# Patient Record
Sex: Female | Born: 1935 | Race: White | Hispanic: No | Smoking: Never smoker
Health system: Southern US, Community
[De-identification: ages and names within clinical notes are randomized; demographics above are authoritative.]

## PROBLEM LIST (undated history)

## (undated) DIAGNOSIS — G2581 Restless legs syndrome: Secondary | ICD-10-CM

## (undated) DIAGNOSIS — G4730 Sleep apnea, unspecified: Secondary | ICD-10-CM

## (undated) DIAGNOSIS — I10 Essential (primary) hypertension: Secondary | ICD-10-CM

---

## 2012-12-19 ENCOUNTER — Ambulatory Visit: Payer: Medicare Other | Attending: Neurology | Admitting: Physical Therapy

## 2012-12-19 DIAGNOSIS — IMO0001 Reserved for inherently not codable concepts without codable children: Secondary | ICD-10-CM | POA: Insufficient documentation

## 2012-12-19 DIAGNOSIS — M2569 Stiffness of other specified joint, not elsewhere classified: Secondary | ICD-10-CM | POA: Insufficient documentation

## 2012-12-19 DIAGNOSIS — M542 Cervicalgia: Secondary | ICD-10-CM | POA: Insufficient documentation

## 2012-12-23 ENCOUNTER — Ambulatory Visit: Payer: Medicare Other | Admitting: Physical Therapy

## 2012-12-25 ENCOUNTER — Ambulatory Visit: Payer: Medicare Other | Admitting: Physical Therapy

## 2012-12-31 ENCOUNTER — Ambulatory Visit: Payer: Medicare Other | Attending: Neurology | Admitting: Physical Therapy

## 2012-12-31 DIAGNOSIS — M542 Cervicalgia: Secondary | ICD-10-CM | POA: Insufficient documentation

## 2012-12-31 DIAGNOSIS — IMO0001 Reserved for inherently not codable concepts without codable children: Secondary | ICD-10-CM | POA: Insufficient documentation

## 2012-12-31 DIAGNOSIS — M2569 Stiffness of other specified joint, not elsewhere classified: Secondary | ICD-10-CM | POA: Insufficient documentation

## 2013-01-02 ENCOUNTER — Ambulatory Visit: Payer: Medicare Other | Admitting: Physical Therapy

## 2013-01-07 ENCOUNTER — Ambulatory Visit: Payer: Medicare Other | Admitting: Physical Therapy

## 2013-01-09 ENCOUNTER — Ambulatory Visit: Payer: Medicare Other | Admitting: Physical Therapy

## 2013-01-15 ENCOUNTER — Ambulatory Visit: Payer: Medicare Other | Admitting: Physical Therapy

## 2013-01-17 ENCOUNTER — Ambulatory Visit: Payer: Medicare Other | Admitting: Physical Therapy

## 2013-01-23 ENCOUNTER — Ambulatory Visit: Payer: Medicare Other | Admitting: Rehabilitation

## 2013-01-24 ENCOUNTER — Ambulatory Visit: Payer: Medicare Other | Admitting: Physical Therapy

## 2013-01-28 ENCOUNTER — Ambulatory Visit: Payer: Medicare Other | Admitting: Physical Therapy

## 2013-01-30 ENCOUNTER — Ambulatory Visit: Payer: Medicare Other | Attending: Neurology | Admitting: Physical Therapy

## 2013-01-30 DIAGNOSIS — M542 Cervicalgia: Secondary | ICD-10-CM | POA: Insufficient documentation

## 2013-01-30 DIAGNOSIS — M2569 Stiffness of other specified joint, not elsewhere classified: Secondary | ICD-10-CM | POA: Insufficient documentation

## 2013-01-30 DIAGNOSIS — IMO0001 Reserved for inherently not codable concepts without codable children: Secondary | ICD-10-CM | POA: Insufficient documentation

## 2013-02-03 ENCOUNTER — Ambulatory Visit: Payer: Medicare Other | Admitting: Physical Therapy

## 2013-02-07 ENCOUNTER — Ambulatory Visit: Payer: Medicare Other | Admitting: Physical Therapy

## 2013-02-11 ENCOUNTER — Ambulatory Visit: Payer: Medicare Other | Admitting: Physical Therapy

## 2013-02-12 ENCOUNTER — Ambulatory Visit: Payer: Medicare Other | Admitting: Physical Therapy

## 2013-02-17 ENCOUNTER — Ambulatory Visit: Payer: Medicare Other | Admitting: Physical Therapy

## 2013-02-19 ENCOUNTER — Ambulatory Visit: Payer: Medicare Other | Admitting: Physical Therapy

## 2013-02-25 ENCOUNTER — Ambulatory Visit: Payer: Medicare Other | Attending: Neurology | Admitting: Physical Therapy

## 2013-02-25 DIAGNOSIS — M2569 Stiffness of other specified joint, not elsewhere classified: Secondary | ICD-10-CM | POA: Insufficient documentation

## 2013-02-25 DIAGNOSIS — M542 Cervicalgia: Secondary | ICD-10-CM | POA: Insufficient documentation

## 2013-02-25 DIAGNOSIS — IMO0001 Reserved for inherently not codable concepts without codable children: Secondary | ICD-10-CM | POA: Insufficient documentation

## 2013-02-27 ENCOUNTER — Ambulatory Visit: Payer: Medicare Other | Admitting: Physical Therapy

## 2013-03-03 ENCOUNTER — Ambulatory Visit: Payer: Medicare Other | Admitting: Physical Therapy

## 2013-03-07 ENCOUNTER — Ambulatory Visit: Payer: Medicare Other | Admitting: Rehabilitation

## 2013-03-11 ENCOUNTER — Ambulatory Visit: Payer: Medicare Other | Admitting: Physical Therapy

## 2013-03-12 ENCOUNTER — Ambulatory Visit: Payer: Medicare Other | Admitting: Physical Therapy

## 2014-02-05 ENCOUNTER — Ambulatory Visit: Payer: Medicare Other | Attending: Foot & Ankle Surgery | Admitting: Physical Therapy

## 2014-02-05 DIAGNOSIS — M25579 Pain in unspecified ankle and joints of unspecified foot: Secondary | ICD-10-CM | POA: Insufficient documentation

## 2014-02-05 DIAGNOSIS — R262 Difficulty in walking, not elsewhere classified: Secondary | ICD-10-CM | POA: Insufficient documentation

## 2014-02-05 DIAGNOSIS — IMO0001 Reserved for inherently not codable concepts without codable children: Secondary | ICD-10-CM | POA: Insufficient documentation

## 2014-02-10 ENCOUNTER — Ambulatory Visit: Payer: Medicare Other | Admitting: Physical Therapy

## 2014-02-12 ENCOUNTER — Ambulatory Visit: Payer: Medicare Other | Admitting: Physical Therapy

## 2014-02-17 ENCOUNTER — Ambulatory Visit: Payer: Medicare Other | Admitting: Physical Therapy

## 2014-02-19 ENCOUNTER — Ambulatory Visit: Payer: Medicare Other | Admitting: Physical Therapy

## 2014-02-26 ENCOUNTER — Ambulatory Visit: Payer: Medicare Other | Attending: Foot & Ankle Surgery | Admitting: Physical Therapy

## 2014-02-26 DIAGNOSIS — M25579 Pain in unspecified ankle and joints of unspecified foot: Secondary | ICD-10-CM | POA: Insufficient documentation

## 2014-02-26 DIAGNOSIS — IMO0001 Reserved for inherently not codable concepts without codable children: Secondary | ICD-10-CM | POA: Insufficient documentation

## 2014-02-26 DIAGNOSIS — R262 Difficulty in walking, not elsewhere classified: Secondary | ICD-10-CM | POA: Insufficient documentation

## 2014-03-03 ENCOUNTER — Ambulatory Visit: Payer: Medicare Other | Admitting: Physical Therapy

## 2014-03-12 ENCOUNTER — Ambulatory Visit: Payer: Medicare Other | Admitting: Physical Therapy

## 2014-03-13 ENCOUNTER — Ambulatory Visit: Payer: Medicare Other | Admitting: Physical Therapy

## 2015-01-25 ENCOUNTER — Emergency Department (HOSPITAL_BASED_OUTPATIENT_CLINIC_OR_DEPARTMENT_OTHER)
Admission: EM | Admit: 2015-01-25 | Discharge: 2015-01-26 | Disposition: A | Discharge status: Home / Self Care | Payer: Medicare Other | Attending: Emergency Medicine | Admitting: Emergency Medicine

## 2015-01-25 ENCOUNTER — Encounter (HOSPITAL_BASED_OUTPATIENT_CLINIC_OR_DEPARTMENT_OTHER): Payer: Self-pay | Admitting: *Deleted

## 2015-01-25 DIAGNOSIS — I10 Essential (primary) hypertension: Secondary | ICD-10-CM | POA: Diagnosis not present

## 2015-01-25 DIAGNOSIS — N3000 Acute cystitis without hematuria: Secondary | ICD-10-CM

## 2015-01-25 DIAGNOSIS — G2581 Restless legs syndrome: Secondary | ICD-10-CM | POA: Diagnosis not present

## 2015-01-25 DIAGNOSIS — Z79899 Other long term (current) drug therapy: Secondary | ICD-10-CM | POA: Insufficient documentation

## 2015-01-25 DIAGNOSIS — R52 Pain, unspecified: Secondary | ICD-10-CM | POA: Diagnosis present

## 2015-01-25 HISTORY — DX: Restless legs syndrome: G25.81

## 2015-01-25 HISTORY — DX: Essential (primary) hypertension: I10

## 2015-01-25 HISTORY — DX: Sleep apnea, unspecified: G47.30

## 2015-01-25 LAB — CBC
HCT: 37.4 % (ref 36.0–46.0)
Hemoglobin: 12.6 g/dL (ref 12.0–15.0)
MCH: 28 pg (ref 26.0–34.0)
MCHC: 33.7 g/dL (ref 30.0–36.0)
MCV: 83.1 fL (ref 78.0–100.0)
PLATELETS: 245 10*3/uL (ref 150–400)
RBC: 4.5 MIL/uL (ref 3.87–5.11)
RDW: 13.9 % (ref 11.5–15.5)
WBC: 10.8 10*3/uL — AB (ref 4.0–10.5)

## 2015-01-25 NOTE — ED Notes (Signed)
Pt c/o generalized body aches x 4 days, seen by PMD x 3 days Dx restless leg referred to Neuro appt sched for 3/3

## 2015-01-26 DIAGNOSIS — N3000 Acute cystitis without hematuria: Secondary | ICD-10-CM | POA: Diagnosis not present

## 2015-01-26 LAB — URINALYSIS, ROUTINE W REFLEX MICROSCOPIC
BILIRUBIN URINE: NEGATIVE
Glucose, UA: NEGATIVE mg/dL
KETONES UR: NEGATIVE mg/dL
Nitrite: NEGATIVE
Protein, ur: NEGATIVE mg/dL
Specific Gravity, Urine: 1.012 (ref 1.005–1.030)
Urobilinogen, UA: 0.2 mg/dL (ref 0.0–1.0)
pH: 5.5 (ref 5.0–8.0)

## 2015-01-26 LAB — COMPREHENSIVE METABOLIC PANEL
ALT: 59 U/L — ABNORMAL HIGH (ref 0–35)
ANION GAP: 7 (ref 5–15)
AST: 68 U/L — AB (ref 0–37)
Albumin: 3.3 g/dL — ABNORMAL LOW (ref 3.5–5.2)
Alkaline Phosphatase: 81 U/L (ref 39–117)
BUN: 53 mg/dL — ABNORMAL HIGH (ref 6–23)
CHLORIDE: 104 mmol/L (ref 96–112)
CO2: 25 mmol/L (ref 19–32)
Calcium: 8.8 mg/dL (ref 8.4–10.5)
Creatinine, Ser: 1.35 mg/dL — ABNORMAL HIGH (ref 0.50–1.10)
GFR calc Af Amer: 42 mL/min — ABNORMAL LOW (ref >90)
GFR calc non Af Amer: 36 mL/min — ABNORMAL LOW (ref >90)
Glucose, Bld: 113 mg/dL — ABNORMAL HIGH (ref 70–99)
POTASSIUM: 3.8 mmol/L (ref 3.5–5.1)
Sodium: 136 mmol/L (ref 135–145)
Total Bilirubin: 0.5 mg/dL (ref 0.3–1.2)
Total Protein: 7.8 g/dL (ref 6.0–8.3)

## 2015-01-26 LAB — URINE MICROSCOPIC-ADD ON

## 2015-01-26 MED ORDER — CEPHALEXIN 500 MG PO CAPS: 500.0000 mg | ORAL_CAPSULE | Freq: Two times a day (BID) | ORAL | Status: DC

## 2015-01-26 MED ORDER — CEPHALEXIN 250 MG PO CAPS
1000.0000 mg | ORAL_CAPSULE | Freq: Once | ORAL | Status: AC
  Administered 2015-01-26: 1000 mg via ORAL
  Filled 2015-01-26: qty 4

## 2015-01-26 NOTE — ED Provider Notes (Signed)
CSN: [id number]     Arrival date & time 01/25/15  2331 History  This chart was scribed for Ashley SeamenJohn L Brannon Decaire, MD by Tonye RoyaltyJoshua Chen, ED Scribe. This patient was seen in room MH04/MH04 and the patient's care was started at 12:28 AM.    Chief Complaint  Patient presents with  . Generalized Body Aches   The history is provided by the patient. No language interpreter was used.    HPI Comments: Ashley Espinoza is a 81 y.o. female with history of restless leg syndrome who presents to the Emergency Department complaining of body aches affecting muscles and joints with onset 4 days ago. Symptoms are consistent with restless legs but worse than usual. She went to her PCP and was referred to a specialist for restless legs. She has been started on gabapentin, and her dose is being titrated upward, and has not had significant relief yet. She was instructed to come to ED if pain got bad enough. She denies fever, chills, chest pain, SOB, cough, nausea, vomiting, diarrhea or dysuria.   Past Medical History  Diagnosis Date  . Sleep apnea   . RLS (restless legs syndrome)   . Hypertension    History reviewed. No pertinent past surgical history. History reviewed. No pertinent family history. History  Substance Use Topics  . Smoking status: Never Smoker   . Smokeless tobacco: Not on file  . Alcohol Use: No   OB History    No data available     Review of Systems A complete 10 system review of systems was obtained and all systems are negative except as noted in the HPI and PMH.    Allergies  Ivp dye and Lidocaine  Home Medications   Prior to Admission medications   Medication Sig Start Date End Date Taking? Authorizing Provider  amitriptyline (ELAVIL) 50 MG tablet Take 50 mg by mouth at bedtime.   Yes Historical Provider, MD  furosemide (LASIX) 40 MG tablet Take 40 mg by mouth.   Yes Historical Provider, MD  gabapentin (NEURONTIN) 300 MG capsule Take 300 mg by mouth 3 (three) times daily.   Yes  Historical Provider, MD  olmesartan (BENICAR) 20 MG tablet Take 20 mg by mouth daily.   Yes Historical Provider, MD  probenecid (BENEMID) 500 MG tablet Take 500 mg by mouth 2 (two) times daily.   Yes Historical Provider, MD  cephALEXin (KEFLEX) 500 MG capsule Take 1 capsule (500 mg total) by mouth 2 (two) times daily. 01/26/15   Amiree No L Meridith Romick, MD   BP 138/88 mmHg  Pulse 98  Temp(Src) 98.4 F (36.9 C)  Resp 16  Ht 5\' 6"  (1.676 m)  Wt 189 lb (85.73 kg)  BMI 30.52 kg/m2  SpO2 100%   Physical Exam  Nursing note and vitals reviewed. General: Well-developed, well-nourished female in no acute distress; appearance consistent with age of record HENT: normocephalic; atraumatic Eyes: pupils equal, round and reactive to light; extraocular muscles intact; lens implants Neck: supple Heart: regular rate and rhythm; no murmur Lungs: clear to auscultation bilaterally Abdomen: soft; nondistended; nontender; no masses or hepatosplenomegaly; bowel sounds present Extremities: No deformity; full range of motion; pulses normal Neurologic: Awake, alert and oriented; motor function intact in all extremities and symmetric; no facial droop; twitching of legs Skin: Warm and dry Psychiatric: Normal mood and affect   ED Course  Procedures (including critical care time)  DIAGNOSTIC STUDIES: Oxygen Saturation is 100% on room air, normal by my interpretation.    COORDINATION OF  CARE: 12:37 AM Discussed treatment plan with patient at beside, the patient agrees with the plan and has no further questions at this time.   MDM   Results for orders placed or performed during the hospital encounter of 01/25/15 (from the past 24 hour(s))  CBC     Status: Abnormal   Collection Time: 01/25/15 11:43 PM  Result Value Ref Range   WBC 10.8 (H) 4.0 - 10.5 K/uL   RBC 4.50 3.87 - 5.11 MIL/uL   Hemoglobin 12.6 12.0 - 15.0 g/dL   HCT 16.1 09.6 - 04.5 %   MCV 83.1 78.0 - 100.0 fL   MCH 28.0 26.0 - 34.0 pg   MCHC 33.7  30.0 - 36.0 g/dL   RDW 40.9 81.1 - 91.4 %   Platelets 245 150 - 400 K/uL  Comprehensive metabolic panel     Status: Abnormal   Collection Time: 01/25/15 11:43 PM  Result Value Ref Range   Sodium 136 135 - 145 mmol/L   Potassium 3.8 3.5 - 5.1 mmol/L   Chloride 104 96 - 112 mmol/L   CO2 25 19 - 32 mmol/L   Glucose, Bld 113 (H) 70 - 99 mg/dL   BUN 53 (H) 6 - 23 mg/dL   Creatinine, Ser 7.82 (H) 0.50 - 1.10 mg/dL   Calcium 8.8 8.4 - 95.6 mg/dL   Total Protein 7.8 6.0 - 8.3 g/dL   Albumin 3.3 (L) 3.5 - 5.2 g/dL   AST 68 (H) 0 - 37 U/L   ALT 59 (H) 0 - 35 U/L   Alkaline Phosphatase 81 39 - 117 U/L   Total Bilirubin 0.5 0.3 - 1.2 mg/dL   GFR calc non Af Amer 36 (L) >90 mL/min   GFR calc Af Amer 42 (L) >90 mL/min   Anion gap 7 5 - 15  Urinalysis, Routine w reflex microscopic     Status: Abnormal   Collection Time: 01/25/15 11:50 PM  Result Value Ref Range   Color, Urine YELLOW YELLOW   APPearance CLOUDY (A) CLEAR   Specific Gravity, Urine 1.012 1.005 - 1.030   pH 5.5 5.0 - 8.0   Glucose, UA NEGATIVE NEGATIVE mg/dL   Hgb urine dipstick SMALL (A) NEGATIVE   Bilirubin Urine NEGATIVE NEGATIVE   Ketones, ur NEGATIVE NEGATIVE mg/dL   Protein, ur NEGATIVE NEGATIVE mg/dL   Urobilinogen, UA 0.2 0.0 - 1.0 mg/dL   Nitrite NEGATIVE NEGATIVE   Leukocytes, UA LARGE (A) NEGATIVE  Urine microscopic-add on     Status: Abnormal   Collection Time: 01/25/15 11:50 PM  Result Value Ref Range   Squamous Epithelial / LPF RARE RARE   WBC, UA 21-50 <3 WBC/hpf   RBC / HPF 0-2 <3 RBC/hpf   Bacteria, UA MANY (A) RARE    Final diagnoses:  Acute cystitis without hematuria  Restless leg syndrome   I personally performed the services described in this documentation, which was scribed in my presence. The recorded information has been reviewed and is accurate.   Ashley Seamen, MD 01/26/15 [phone]

## 2017-05-23 ENCOUNTER — Encounter (HOSPITAL_BASED_OUTPATIENT_CLINIC_OR_DEPARTMENT_OTHER): Payer: Self-pay | Admitting: *Deleted

## 2017-05-23 ENCOUNTER — Emergency Department (HOSPITAL_BASED_OUTPATIENT_CLINIC_OR_DEPARTMENT_OTHER)
Admission: EM | Admit: 2017-05-23 | Discharge: 2017-05-23 | Disposition: A | Discharge status: Home / Self Care | Payer: Medicare Other | Attending: Emergency Medicine | Admitting: Emergency Medicine

## 2017-05-23 DIAGNOSIS — M353 Polymyalgia rheumatica: Secondary | ICD-10-CM | POA: Diagnosis not present

## 2017-05-23 DIAGNOSIS — M130 Polyarthritis, unspecified: Secondary | ICD-10-CM | POA: Insufficient documentation

## 2017-05-23 DIAGNOSIS — N3000 Acute cystitis without hematuria: Secondary | ICD-10-CM | POA: Insufficient documentation

## 2017-05-23 DIAGNOSIS — R300 Dysuria: Secondary | ICD-10-CM | POA: Diagnosis present

## 2017-05-23 DIAGNOSIS — Z79899 Other long term (current) drug therapy: Secondary | ICD-10-CM | POA: Diagnosis not present

## 2017-05-23 DIAGNOSIS — I10 Essential (primary) hypertension: Secondary | ICD-10-CM | POA: Insufficient documentation

## 2017-05-23 LAB — URINALYSIS, MICROSCOPIC (REFLEX): RBC / HPF: NONE SEEN RBC/hpf (ref 0–5)

## 2017-05-23 LAB — URINALYSIS, ROUTINE W REFLEX MICROSCOPIC
Bilirubin Urine: NEGATIVE
Glucose, UA: NEGATIVE mg/dL
KETONES UR: NEGATIVE mg/dL
Nitrite: NEGATIVE
PH: 7 (ref 5.0–8.0)
Protein, ur: NEGATIVE mg/dL
Specific Gravity, Urine: 1.012 (ref 1.005–1.030)

## 2017-05-23 LAB — URIC ACID: Uric Acid, Serum: 10.3 mg/dL — ABNORMAL HIGH (ref 2.3–6.6)

## 2017-05-23 LAB — CBC WITH DIFFERENTIAL/PLATELET
BASOS ABS: 0 10*3/uL (ref 0.0–0.1)
Basophils Relative: 0 %
EOS PCT: 4 %
Eosinophils Absolute: 0.4 10*3/uL (ref 0.0–0.7)
HCT: 38.7 % (ref 36.0–46.0)
HEMOGLOBIN: 13.8 g/dL (ref 12.0–15.0)
LYMPHS ABS: 1.1 10*3/uL (ref 0.7–4.0)
Lymphocytes Relative: 10 %
MCH: 30 pg (ref 26.0–34.0)
MCHC: 35.7 g/dL (ref 30.0–36.0)
MCV: 84.1 fL (ref 78.0–100.0)
Monocytes Absolute: 0.8 10*3/uL (ref 0.1–1.0)
Monocytes Relative: 8 %
NEUTROS PCT: 78 %
Neutro Abs: 8.2 10*3/uL — ABNORMAL HIGH (ref 1.7–7.7)
PLATELETS: 233 10*3/uL (ref 150–400)
RBC: 4.6 MIL/uL (ref 3.87–5.11)
RDW: 14.1 % (ref 11.5–15.5)
WBC: 10.4 10*3/uL (ref 4.0–10.5)

## 2017-05-23 LAB — COMPREHENSIVE METABOLIC PANEL
ALK PHOS: 94 U/L (ref 38–126)
ALT: 35 U/L (ref 14–54)
AST: 21 U/L (ref 15–41)
Albumin: 3.8 g/dL (ref 3.5–5.0)
Anion gap: 13 (ref 5–15)
BUN: 50 mg/dL — AB (ref 6–20)
CHLORIDE: 99 mmol/L — AB (ref 101–111)
CO2: 26 mmol/L (ref 22–32)
CREATININE: 1.75 mg/dL — AB (ref 0.44–1.00)
Calcium: 10 mg/dL (ref 8.9–10.3)
GFR calc Af Amer: 30 mL/min — ABNORMAL LOW (ref >60)
GFR calc non Af Amer: 26 mL/min — ABNORMAL LOW (ref >60)
Glucose, Bld: 136 mg/dL — ABNORMAL HIGH (ref 65–99)
Potassium: 3.7 mmol/L (ref 3.5–5.1)
SODIUM: 138 mmol/L (ref 135–145)
Total Bilirubin: 0.6 mg/dL (ref 0.3–1.2)
Total Protein: 7.7 g/dL (ref 6.5–8.1)

## 2017-05-23 LAB — CK: Total CK: 59 U/L (ref 38–234)

## 2017-05-23 LAB — SEDIMENTATION RATE: Sed Rate: 75 mm/hr — ABNORMAL HIGH (ref 0–22)

## 2017-05-23 MED ORDER — MORPHINE SULFATE (PF) 2 MG/ML IV SOLN
2.0000 mg | INTRAVENOUS | Status: DC | PRN
  Administered 2017-05-23: 2 mg via INTRAVENOUS
  Filled 2017-05-23: qty 1

## 2017-05-23 MED ORDER — METHYLPREDNISOLONE 4 MG PO TBPK: ORAL_TABLET | ORAL | 0 refills | Status: DC

## 2017-05-23 MED ORDER — METHYLPREDNISOLONE SODIUM SUCC 125 MG IJ SOLR
125.0000 mg | Freq: Once | INTRAMUSCULAR | Status: AC
  Administered 2017-05-23: 125 mg via INTRAVENOUS
  Filled 2017-05-23: qty 2

## 2017-05-23 MED ORDER — SULFAMETHOXAZOLE-TRIMETHOPRIM 800-160 MG PO TABS: 1.0000 | ORAL_TABLET | Freq: Two times a day (BID) | ORAL | 0 refills | Status: DC

## 2017-05-23 MED ORDER — DEXTROSE 5 % IV SOLN
1.0000 g | Freq: Once | INTRAVENOUS | Status: AC
  Administered 2017-05-23: 1 g via INTRAVENOUS
  Filled 2017-05-23: qty 10

## 2017-05-23 MED ORDER — OXYCODONE-ACETAMINOPHEN 5-325 MG PO TABS
1.0000 | ORAL_TABLET | Freq: Once | ORAL | Status: AC
  Administered 2017-05-23: 1 via ORAL
  Filled 2017-05-23: qty 1

## 2017-05-23 MED ORDER — OXYCODONE HCL 5 MG PO TABS
5.0000 mg | ORAL_TABLET | Freq: Once | ORAL | Status: AC
  Administered 2017-05-23: 5 mg via ORAL
  Filled 2017-05-23: qty 1

## 2017-05-23 MED ORDER — OXYCODONE HCL 5 MG PO TABS: 5.0000 mg | ORAL_TABLET | Freq: Four times a day (QID) | ORAL | 0 refills | Status: DC | PRN

## 2017-05-23 NOTE — ED Notes (Signed)
Pt unable to void at this time. 

## 2017-05-23 NOTE — ED Provider Notes (Signed)
[facility]   CSN: [id number] Arrival date & time: 05/23/17  1453     History   Chief Complaint Chief Complaint  Patient presents with  . Weakness    HPI Ashley Espinoza is a 83 y.o. female. CC: joint pain, and ? UTI.  HPI:  83 year old female, here with her daughter. She reports 4 days of symptoms. She states initially she felt she may have a bladder infection with some dysuria. This went away. She began getting some joint pain in her wrist. Strangely, she states that this is been a symptom of UTIs ever since she was "a little girl". Her dysuria has resolved. She has multiple areas of joint pain both ankles primarily her left knee left wrist left elbow. No rash. She started taking Macrobid one dose per day on Wednesday, 7 days ago. Symptoms have worsened. She was limping on her left leg last night.  She has a history of gout. Cannot tell me what joints of his involved in the past but thinks it may have been her left knee. She takes probenecid daily as prophylaxis for this. Has history of chronic renal insufficiency cannot take anti-inflammatories.  Past Medical History:  Diagnosis Date  . Hypertension   . RLS (restless legs syndrome)   . Sleep apnea     There are no active problems to display for this patient.   History reviewed. No pertinent surgical history.  OB History    No data available       Home Medications    Prior to Admission medications   Medication Sig Start Date End Date Taking? Authorizing Provider  nitrofurantoin, macrocrystal-monohydrate, (MACROBID) 100 MG capsule Take 100 mg by mouth 2 (two) times daily.   Yes [provider]  amitriptyline (ELAVIL) 50 MG tablet Take 50 mg by mouth at bedtime.    [provider]  furosemide (LASIX) 40 MG tablet Take 40 mg by mouth.    [provider]  gabapentin (NEURONTIN) 300 MG capsule Take 300 mg by mouth 3 (three) times daily.    [provider]    methylPREDNISolone (MEDROL DOSEPAK) 4 MG TBPK tablet 6 po on day 1, decrease by 1 tab per day 05/23/17   Rolland PorterJames, Roniesha Hollingshead, MD  olmesartan (BENICAR) 20 MG tablet Take 20 mg by mouth daily.    [provider]  oxyCODONE (ROXICODONE) 5 MG immediate release tablet Take 1 tablet (5 mg total) by mouth every 6 (six) hours as needed for severe pain. 05/23/17   Rolland PorterJames, Everette Dimauro, MD  probenecid (BENEMID) 500 MG tablet Take 500 mg by mouth 2 (two) times daily.    [provider]  sulfamethoxazole-trimethoprim (BACTRIM DS,SEPTRA DS) 800-160 MG tablet Take 1 tablet by mouth 2 (two) times daily. 05/23/17   Rolland PorterJames, Jakyri Brunkhorst, MD    Family History History reviewed. No pertinent family history.  Social History Social History  Substance Use Topics  . Smoking status: Never Smoker  . Smokeless tobacco: Never Used  . Alcohol use No     Allergies   Ivp dye [iodinated diagnostic agents] and Lidocaine   Review of Systems Review of Systems  Constitutional: Negative for appetite change, chills, diaphoresis, fatigue and fever.  HENT: Negative for mouth sores, sore throat and trouble swallowing.   Eyes: Negative for visual disturbance.  Respiratory: Negative for cough, chest tightness, shortness of breath and wheezing.   Cardiovascular: Negative for chest pain.  Gastrointestinal: Negative for abdominal distention, abdominal pain, diarrhea, nausea and vomiting.  Endocrine: Negative for polydipsia, polyphagia and polyuria.  Genitourinary: Positive for dysuria. Negative for frequency and hematuria.  Musculoskeletal: Positive for arthralgias. Negative for gait problem.  Skin: Negative for color change, pallor and rash.  Neurological: Negative for dizziness, syncope, light-headedness and headaches.  Hematological: Does not bruise/bleed easily.  Psychiatric/Behavioral: Negative for behavioral problems and confusion.     Physical Exam Updated Vital Signs BP 129/79 (BP Location: Left Arm)   Pulse 98   Temp  98.3 F (36.8 C) (Oral)   Resp 16   Ht 5\' 7"  (1.702 m)   Wt 89.8 kg (198 lb)   SpO2 92%   BMI 31.01 kg/m   Physical Exam  Constitutional: She is oriented to person, place, and time. She appears well-developed and well-nourished. No distress.  HENT:  Head: Normocephalic.  Eyes: Conjunctivae are normal. Pupils are equal, round, and reactive to light. No scleral icterus.  Neck: Normal range of motion. Neck supple. No thyromegaly present.  Cardiovascular: Normal rate and regular rhythm.  Exam reveals no gallop and no friction rub.   No murmur heard. Pulmonary/Chest: Effort normal and breath sounds normal. No respiratory distress. She has no wheezes. She has no rales.  Abdominal: Soft. Bowel sounds are normal. She exhibits no distension. There is no tenderness. There is no rebound.  Musculoskeletal: Normal range of motion.  4 range of motion of all joints with exception of the left knee which is painful through range. She does not have palpable effusion. No erythema or obvious effusions of any of the involved joints. No skin rash.  Neurological: She is alert and oriented to person, place, and time.  Skin: Skin is warm and dry. No rash noted.  Psychiatric: She has a normal mood and affect. Her behavior is normal.     ED Treatments / Results  Labs (all labs ordered are listed, but only abnormal results are displayed) Labs Reviewed  URINALYSIS, ROUTINE W REFLEX MICROSCOPIC - Abnormal; Notable for the following:       Result Value   APPearance CLOUDY (*)    Hgb urine dipstick TRACE (*)    Leukocytes, UA LARGE (*)    All other components within normal limits  CBC WITH DIFFERENTIAL/PLATELET - Abnormal; Notable for the following:    Neutro Abs 8.2 (*)    All other components within normal limits  COMPREHENSIVE METABOLIC PANEL - Abnormal; Notable for the following:    Chloride 99 (*)    Glucose, Bld 136 (*)    BUN 50 (*)    Creatinine, Ser 1.75 (*)    GFR calc non Af Amer 26 (*)     GFR calc Af Amer 30 (*)    All other components within normal limits  SEDIMENTATION RATE - Abnormal; Notable for the following:    Sed Rate 75 (*)    All other components within normal limits  URIC ACID - Abnormal; Notable for the following:    Uric Acid, Serum 10.3 (*)    All other components within normal limits  URINALYSIS, MICROSCOPIC (REFLEX) - Abnormal; Notable for the following:    Bacteria, UA FEW (*)    Squamous Epithelial / LPF 0-5 (*)    All other components within normal limits  URINE CULTURE  CK    EKG  EKG Interpretation None       Radiology No results found.  Procedures Procedures (including critical care time)  Medications Ordered in ED Medications  cefTRIAXone (ROCEPHIN) 1 g in dextrose 5 % 50 mL  IVPB (not administered)  methylPREDNISolone sodium succinate (SOLU-MEDROL) 125 mg/2 mL injection 125 mg (not administered)  morphine 2 MG/ML injection 2 mg (not administered)  oxyCODONE (Oxy IR/ROXICODONE) immediate release tablet 5 mg (not administered)  oxyCODONE-acetaminophen (PERCOCET/ROXICET) 5-325 MG per tablet 1 tablet (1 tablet Oral Given 05/23/17 1550)     Initial Impression / Assessment and Plan / ED Course  I have reviewed the triage vital signs and the nursing notes.  Pertinent labs & imaging results that were available during my care of the patient were reviewed by me and considered in my medical decision making (see chart for details).     No fever. No leukocytosis. Sedimentation rate 75. Uric acid 10.5. Urine appears infected. Culture pending. Discussion I think this is UTI likely resistant to her Macrobid which she has used extensively in the past. Her joint pain could be gout, although upon polyarthralgia would be unusual. Sedimentation rate elevation could suggest polymyalgia rheumatica. With her renal insufficiency will treat with IV Solu-Medrol here followed by medical Dosepak. IV Rocephin here followed by Bactrim. Limited amount of oxycodone  for pain. Primary care follow-up for completion of antibiotics for test of cure, and culture review.  Final Clinical Impressions(s) / ED Diagnoses   Final diagnoses:  Polyarthritis  PMR (polymyalgia rheumatica) (HCC)  Acute cystitis without hematuria    New Prescriptions New Prescriptions   METHYLPREDNISOLONE (MEDROL DOSEPAK) 4 MG TBPK TABLET    6 po on day 1, decrease by 1 tab per day   OXYCODONE (ROXICODONE) 5 MG IMMEDIATE RELEASE TABLET    Take 1 tablet (5 mg total) by mouth every 6 (six) hours as needed for severe pain.   SULFAMETHOXAZOLE-TRIMETHOPRIM (BACTRIM DS,SEPTRA DS) 800-160 MG TABLET    Take 1 tablet by mouth 2 (two) times daily.     Rolland Porter, MD 05/23/17 [phone]

## 2017-05-23 NOTE — Discharge Instructions (Signed)
See your primary care physician with in the next 7 days to recheck urine, and condition. Stop Macrobid. Start Bactrim. Medrol--a steroid--tapers down to lower doses over several days.

## 2017-05-23 NOTE — ED Triage Notes (Signed)
Pt c/o generalized weakness x 1 week.  

## 2017-05-23 NOTE — ED Notes (Signed)
Pt reports generalized body aches, joint pains for one week. States she assumed it was a UTI and began Macrobid, reports feeling "a little better."

## 2017-05-25 LAB — URINE CULTURE

## 2018-06-06 ENCOUNTER — Other Ambulatory Visit: Payer: Self-pay

## 2018-06-06 ENCOUNTER — Emergency Department (HOSPITAL_BASED_OUTPATIENT_CLINIC_OR_DEPARTMENT_OTHER): Payer: Medicare Other

## 2018-06-06 ENCOUNTER — Emergency Department (HOSPITAL_BASED_OUTPATIENT_CLINIC_OR_DEPARTMENT_OTHER)
Admission: EM | Admit: 2018-06-06 | Discharge: 2018-06-06 | Disposition: A | Discharge status: Home / Self Care | Payer: Medicare Other | Attending: Emergency Medicine | Admitting: Emergency Medicine

## 2018-06-06 ENCOUNTER — Encounter (HOSPITAL_BASED_OUTPATIENT_CLINIC_OR_DEPARTMENT_OTHER): Payer: Self-pay | Admitting: Emergency Medicine

## 2018-06-06 DIAGNOSIS — M545 Low back pain: Secondary | ICD-10-CM | POA: Diagnosis present

## 2018-06-06 DIAGNOSIS — M5431 Sciatica, right side: Secondary | ICD-10-CM | POA: Diagnosis not present

## 2018-06-06 DIAGNOSIS — I10 Essential (primary) hypertension: Secondary | ICD-10-CM | POA: Diagnosis not present

## 2018-06-06 DIAGNOSIS — Z79899 Other long term (current) drug therapy: Secondary | ICD-10-CM | POA: Insufficient documentation

## 2018-06-06 MED ORDER — OXYCODONE-ACETAMINOPHEN 5-325 MG PO TABS
1.0000 | ORAL_TABLET | ORAL | Status: AC | PRN
  Administered 2018-06-06 (×2): 1 via ORAL
  Filled 2018-06-06 (×2): qty 1

## 2018-06-06 MED ORDER — DICLOFENAC SODIUM 1 % TD GEL: 2.0000 g | Freq: Four times a day (QID) | TRANSDERMAL | 0 refills | Status: DC | PRN

## 2018-06-06 MED ORDER — DEXAMETHASONE 6 MG PO TABS
10.0000 mg | ORAL_TABLET | Freq: Once | ORAL | Status: AC
  Administered 2018-06-06: 10 mg via ORAL
  Filled 2018-06-06: qty 1

## 2018-06-06 NOTE — ED Triage Notes (Addendum)
R low back pain radiating into hip and leg. Denies injury. She is tearful

## 2018-06-06 NOTE — ED Provider Notes (Signed)
[facility] EMERGENCY DEPARTMENT Provider Note   CSN: [id number] Arrival date & time: 06/06/18  1116     History   Chief Complaint Chief Complaint  Patient presents with  . Back Pain    HPI Ashley Espinoza is a 84 y.o. female.  The history is provided by the patient. No language interpreter was used.  Back Pain     Ashley Espinoza is a 84 y.o. female who presents to the Emergency Department complaining of back pain.  She reports that several days of low back pain on the right side radiating down to the right foot. No reports of injuries or similar prior symptoms. Pain is worse at night when trying to sleep and when sitting or lying. Pain is better with ambulation. Pain is described as constant nature. She denies any fevers, abdominal pain, nausea, vomiting, dysuria, hematuria. She feels like her sciatic nerve is inflamed. She has a history of back problems in the past. No reports of rash. She took the hydrocodone last night as well as ibuprofen with no significant improvement in her symptoms. Past Medical History:  Diagnosis Date  . Hypertension   . RLS (restless legs syndrome)   . Sleep apnea     There are no active problems to display for this patient.   History reviewed. No pertinent surgical history.   OB History   None      Home Medications    Prior to Admission medications   Medication Sig Start Date End Date Taking? Authorizing Provider  amitriptyline (ELAVIL) 50 MG tablet Take 50 mg by mouth at bedtime.    [provider]  diclofenac sodium (VOLTAREN) 1 % GEL Apply 2 g topically 4 (four) times daily as needed. 06/06/18   Tilden Fossa, MD  furosemide (LASIX) 40 MG tablet Take 40 mg by mouth.    [provider]  gabapentin (NEURONTIN) 300 MG capsule Take 300 mg by mouth 3 (three) times daily.    [provider]  methylPREDNISolone (MEDROL DOSEPAK) 4 MG TBPK tablet 6 po on day 1, decrease by 1 tab per day 05/23/17    Rolland Porter, MD  nitrofurantoin, macrocrystal-monohydrate, (MACROBID) 100 MG capsule Take 100 mg by mouth 2 (two) times daily.    [provider]  olmesartan (BENICAR) 20 MG tablet Take 20 mg by mouth daily.    [provider]  oxyCODONE (ROXICODONE) 5 MG immediate release tablet Take 1 tablet (5 mg total) by mouth every 6 (six) hours as needed for severe pain. 05/23/17   Rolland Porter, MD  probenecid (BENEMID) 500 MG tablet Take 500 mg by mouth 2 (two) times daily.    [provider]  sulfamethoxazole-trimethoprim (BACTRIM DS,SEPTRA DS) 800-160 MG tablet Take 1 tablet by mouth 2 (two) times daily. 05/23/17   Rolland Porter, MD    Family History No family history on file.  Social History Social History   Tobacco Use  . Smoking status: Never Smoker  . Smokeless tobacco: Never Used  Substance Use Topics  . Alcohol use: No  . Drug use: No     Allergies   Ivp dye [iodinated diagnostic agents] and Lidocaine   Review of Systems Review of Systems  Musculoskeletal: Positive for back pain.  All other systems reviewed and are negative.    Physical Exam Updated Vital Signs BP (!) 172/102   Pulse 60   Temp 98.4 F (36.9 C) (Oral)   Resp 18   Ht 5\' 7"  (1.702 m)  Wt 87.1 kg (192 lb)   SpO2 96%   BMI 30.07 kg/m   Physical Exam  Constitutional: She is oriented to person, place, and time. She appears well-developed and well-nourished.  HENT:  Head: Normocephalic and atraumatic.  Cardiovascular: Normal rate and regular rhythm.  No murmur heard. Pulmonary/Chest: Effort normal and breath sounds normal. No respiratory distress.  Abdominal: Soft. There is no tenderness. There is no rebound and no guarding.  Musculoskeletal: She exhibits no tenderness.  2+ DP pulses bilaterally. None pitting edema to bilateral lower extremities. There is no tenderness to palpation throughout the lumbar spine or low back. No CVA tenderness. No tenderness to palpation throughout  the hips or legs.  Neurological: She is alert and oriented to person, place, and time.  5/5 strength in all four extremities with sensation to light touch intact in all four extremities.    Skin: Skin is warm and dry.  Psychiatric: She has a normal mood and affect. Her behavior is normal.  Nursing note and vitals reviewed.    ED Treatments / Results  Labs (all labs ordered are listed, but only abnormal results are displayed) Labs Reviewed - No data to display  EKG None  Radiology Dg Lumbar Spine Complete  Result Date: 06/06/2018 CLINICAL DATA:  Low back and severe right lower extremity pain for 2 days. No known injury. EXAM: LUMBAR SPINE - COMPLETE 4+ VIEW COMPARISON:  None. FINDINGS: No fracture is identified. The patient has severe multilevel degenerative disease with marked loss of disc space height from T10-L4 and at L5-S1. Multilevel facet arthropathy is worst at L4-5 and L5-S1 and results in 0.6 cm anterolisthesis L4 on L5. Paraspinous structures demonstrate aortoiliac atherosclerosis. IMPRESSION: No acute abnormality. Advanced multilevel degenerative disease. Atherosclerosis. Electronically Signed   By: Drusilla Kannerhomas  Dalessio M.D.   On: 06/06/2018 14:22    Procedures Procedures (including critical care time)  Medications Ordered in ED Medications  oxyCODONE-acetaminophen (PERCOCET/ROXICET) 5-325 MG per tablet 1 tablet (1 tablet Oral Given 06/06/18 1524)  dexamethasone (DECADRON) tablet 10 mg (10 mg Oral Given 06/06/18 1524)     Initial Impression / Assessment and Plan / ED Course  I have reviewed the triage vital signs and the nursing notes.  Pertinent labs & imaging results that were available during my care of the patient were reviewed by me and considered in my medical decision making (see chart for details).     Patient here for evaluation of right sided pain that radiates down the right lower extremity. She is neurovascular intact on examination. Records reviewed in care  everywhere. She does have renal insufficiency with baseline creatinine between 1.2 to 1.3. She has had an outpatient ultrasound is negative for aortic aneurysm in the last three years. Current presentation is not consistent with septic arthritis, cauda equina, renal colic, aortic dissection, aortic aneurysm. Discussed with patient home care for sciatica. She already has hydrocodone prescribed QHS for restless leg. Discussed with patient taking Tylenol during the day. Will provide one-time dose of Decadron for inflammation. Prescribing Voltaren gel. Counseled patient on home care as well as return precautions. Given her baseline renal insufficiency will not provide any additional anti-inflammatories.  Final Clinical Impressions(s) / ED Diagnoses   Final diagnoses:  Sciatica of right side    ED Discharge Orders        Ordered    diclofenac sodium (VOLTAREN) 1 % GEL  4 times daily PRN     06/06/18 1526       Tilden Fossaees, Arlyss, MD  06/06/18 1546  

## 2018-06-06 NOTE — ED Notes (Signed)
Pt. Has not had her B/P meds today she reports to [facility] at time of discharge

## 2018-06-06 NOTE — Discharge Instructions (Addendum)
You can take acetaminophen (tylenol) 650 mg by mouth three times daily as needed for pain.

## 2018-10-08 ENCOUNTER — Ambulatory Visit: Payer: Medicare Other | Attending: Internal Medicine | Admitting: Physical Therapy

## 2018-10-08 ENCOUNTER — Other Ambulatory Visit: Payer: Self-pay

## 2018-10-08 DIAGNOSIS — M545 Low back pain, unspecified: Secondary | ICD-10-CM

## 2018-10-08 DIAGNOSIS — R2689 Other abnormalities of gait and mobility: Secondary | ICD-10-CM | POA: Insufficient documentation

## 2018-10-08 NOTE — Therapy (Signed)
[facility] [address] [place], [place], [address] Phone: [phone]   Fax:  [phone]  Physical Therapy Evaluation  Patient Details  Name: Ashley Espinoza MRN: [id number] Date of Birth: 05/19/1932 Referring Provider (PT): Worthy RancherEdgardo Maldonado, MD   Encounter Date: 10/08/2018  PT End of Session - 10/08/18 1433    Visit Number  1    Date for PT Re-Evaluation  12/03/18    PT Start Time  1350    PT Stop Time  1433    PT Time Calculation (min)  43 min    Activity Tolerance  Patient limited by pain    Behavior During Therapy  [facility] for tasks assessed/performed       Past Medical History:  Diagnosis Date  . Hypertension   . RLS (restless legs syndrome)   . Sleep apnea     No past surgical history on file.  There were no vitals filed for this visit.   Subjective Assessment - 10/08/18 1407    Subjective  Patient began experiencing low back pain about 6 weeks ago which intermittently goes into BLE. Pain onset is almost immediate upon standing and she feels immediate relief when she sits down. She started using a RW 2 weeks ago due to feeling unsteady. She had an incident of RLE sciatica in July 2019 which she states resolved.     Pertinent History  CKD, heart valve replacement, OA, sciatica R side (July 2019), R shoulder limited ROM, torn left foot tendon, severe OA left knee    Limitations  Standing;Walking    How long can you stand comfortably?  < 15 min    How long can you walk comfortably?  < 15 min    Patient Stated Goals  to get rid of pain    Currently in Pain?  Yes    Pain Score  8     Pain Location  Back    Pain Orientation  Right;Lower    Pain Descriptors / Indicators  Sharp   feels muscular   Pain Type  Acute pain    Pain Onset  More than a month ago    Pain Frequency  Intermittent    Aggravating Factors   standing    Pain Relieving Factors  sitting         [facility] PT Assessment - 10/08/18 0001       Assessment   Medical Diagnosis  lumbar radiculopathy    Referring Provider (PT)  Worthy RancherEdgardo Maldonado, MD    Onset Date/Surgical Date  08/27/18    Hand Dominance  Left    Next MD Visit  January     Prior Therapy  no      Precautions   Precautions  None      Restrictions   Weight Bearing Restrictions  No      Balance Screen   Has the patient fallen in the past 6 months  No    Has the patient had a decrease in activity level because of a fear of falling?   No    Is the patient reluctant to leave their home because of a fear of falling?   No      Home Environment   Living Environment  Private residence    Living Arrangements  Alone    Available Help at Discharge  Family    Additional Comments  daughter close by      Prior Function   Level of  Independence  Independent with basic ADLs    Vocation  Retired      Observation/Other Assessments   Focus on Therapeutic Outcomes (FOTO)   46% limited      Functional Tests   Functional tests  Sit to Stand      Sit to Stand   Comments  20 seconds      Posture/Postural Control   Posture/Postural Control  Postural limitations    Posture Comments  left knee valgus resulting in depressed left hip and shoulder      ROM / Strength   AROM / PROM / Strength  AROM;Strength      AROM   Overall AROM Comments  lumbar WFL      Strength   Overall Strength Comments  grossly 5/5 in bil hips and knees except ABD 4/5.      Flexibility   Soft Tissue Assessment /Muscle Length  yes    Hamstrings  marked on Left, mod R    Piriformis  WNL      Palpation   Palpation comment  unremarkable but increased tone in R buttocks      Transfers   Five time sit to stand comments   uses momentum and backs of legs into chair intermittently      Ambulation/Gait   Ambulation/Gait  Yes    Ambulation/Gait Assistance  6: Modified independent (Device/Increase time)    Ambulation Distance (Feet)  10 Feet    Assistive device  Rolling walker    Gait Pattern   Step-through pattern    Ambulation Surface  Level                Objective measurements completed on examination: See above findings.              PT Education - 10/08/18 1432    Education Details  HEP    Person(s) Educated  Patient    Methods  Explanation;Demonstration;Handout    Comprehension  Verbalized understanding;Returned demonstration       PT Short Term Goals - 10/08/18 1605      PT SHORT TERM GOAL #1   Title  Ind with initial HEP    Time  3    Period  Weeks    Status  New    Target Date  10/29/18      PT SHORT TERM GOAL #2   Title  Patient able to ambulate safely in clinic without AD    Time  4    Period  Weeks    Status  New    Target Date  11/05/18      PT SHORT TERM GOAL #3   Title  improved 5x sit to stand to < 14 seconds to improve balance    Time  4    Period  Weeks    Status  New        PT Long Term Goals - 10/08/18 1607      PT LONG TERM GOAL #1   Title  Patient able to stand for 30 minutes to perform ADLS with 4/10 pain or less.    Time  8    Period  Weeks    Status  New    Target Date  12/03/18      PT LONG TERM GOAL #2   Title  Patient able to amb in community for 30 min or more with least restrictive AD and 4/10 pain or less.    Time  8    Period  Weeks    Status  New      PT LONG TERM GOAL #3   Title  Pt to be independent in core strengthening HEP    Time  8    Period  Weeks    Status  New             Plan - 10/08/18 1558    Clinical Impression Statement  Patient began experiencing low back pain about 6 weeks ago which intermittently goes into BLE. Pain onset is almost immediate upon standing and she feels immediate relief when she sits down. She says it feels muscular, but she has no significant tone in her low back. She started using a RW 2 weeks ago due to feeling unsteady. She had an incident of RLE sciatica in July 2019 which she states resolved. She has full lumbar ROM and normal strength in BLE  grossly. She has significant postural deficits and left knee pain affecting gait. She will benefit from core and functional strengthening.    History and Personal Factors relevant to plan of care:  CKD, heart valve replacement, OA, sciatica R side (July 2019), R shoulder limited ROM, torn left foot tendon, severe OA left knee    Clinical Presentation  Evolving    Clinical Presentation due to:  worsening sx    Clinical Decision Making  Low    Rehab Potential  Good    PT Frequency  2x / week    PT Duration  8 weeks    PT Treatment/Interventions  ADLs/Self Care Home Management;Cryotherapy;Electrical Stimulation;Traction;Moist Heat;Therapeutic exercise;Neuromuscular re-education;Patient/family education;Manual techniques;Dry needling;Gait training    PT Next Visit Plan  functional and core strength, review HEP, gait    PT Home Exercise Plan  bridge, ab sets    Consulted and Agree with Plan of Care  Patient       Patient will benefit from skilled therapeutic intervention in order to improve the following deficits and impairments:  Decreased activity tolerance, Decreased strength, Pain, Difficulty walking, Postural dysfunction  Visit Diagnosis: Acute bilateral low back pain, unspecified whether sciatica present - Plan: PT plan of care cert/re-cert  Other abnormalities of gait and mobility - Plan: PT plan of care cert/re-cert     Problem List There are no active problems to display for this patient.   Solon Palm PT 10/08/2018, 4:13 PM  [facility] [address] [place], [place], [address] Phone: [phone]   Fax:  [phone]  Name: Ashley Espinoza MRN: [id number] Date of Birth: Jan 28, 1932

## 2018-10-08 NOTE — Patient Instructions (Signed)
Bridge   Lie back, legs bent. Lift buttocks toward the ceiling. Hold 5 seconds. Repeat 10-30 times. Do __2__ sessions per day.  Copyright  V[employer]. All rights reserved.  Abdominal bracing   Flatten back by tightening stomach muscles and buttocks. Hold 5-10 seconds. Repeat _10___ times per set. Do _1-3___ sets per session. Do __2__ sessions per day.   Solon PalmJulie Rory Montel, PT 10/08/18 2:32 PM [facility] [address] [place], [place], [address] Phone: [phone]   Fax:  [phone]

## 2018-10-16 ENCOUNTER — Ambulatory Visit: Payer: Medicare Other | Admitting: Physical Therapy

## 2018-10-18 ENCOUNTER — Ambulatory Visit: Payer: Medicare Other | Admitting: Physical Therapy

## 2018-10-18 DIAGNOSIS — M545 Low back pain, unspecified: Secondary | ICD-10-CM

## 2018-10-18 DIAGNOSIS — R2689 Other abnormalities of gait and mobility: Secondary | ICD-10-CM

## 2018-10-18 NOTE — Therapy (Signed)
[facility] [address] [place], [place], [address] Phone: [phone]   Fax:  [phone]  Physical Therapy Treatment  Patient Details  Name: Ashley Hancocklizabeth A Pfiester MRN: [id number] Date of Birth: 04/14/1932 Referring Provider (PT): Worthy RancherEdgardo Maldonado, MD   Encounter Date: 10/18/2018  PT End of Session - 10/18/18 1142    Visit Number  2    Date for PT Re-Evaluation  12/03/18    PT Start Time  1106    PT Stop Time  1148    PT Time Calculation (min)  42 min       Past Medical History:  Diagnosis Date  . Hypertension   . RLS (restless legs syndrome)   . Sleep apnea     No past surgical history on file.  There were no vitals filed for this visit.  Subjective Assessment - 10/18/18 1108    Subjective  standing < 5 - 10 min pain severe 9/10 that ends immediately when I sit. pain started after MD referred me - need to strengthen ( pt verb cancel earlier in week d/t somach bug)    Currently in Pain?  No/denies                       [facility] Adult PT Treatment/Exercise - 10/18/18 0001      Exercises   Exercises  Lumbar;Knee/Hip      Lumbar Exercises: Aerobic   Nustep  L 4 5 min      Lumbar Exercises: Machines for Strengthening   Cybex Lumbar Extension  blue tband 2 sets 10      Lumbar Exercises: Seated   Other Seated Lumbar Exercises  sit fit pelvic ROM  15 each and LE ex 2# 2 x10   scap stab red tband on sit fit   Other Seated Lumbar Exercises  isometric abdominals 2 sets 10 3 sec hold   red tband HS curl on sit sit 2 sets 10              PT Short Term Goals - 10/08/18 1605      PT SHORT TERM GOAL #1   Title  Ind with initial HEP    Time  3    Period  Weeks    Status  New    Target Date  10/29/18      PT SHORT TERM GOAL #2   Title  Patient able to ambulate safely in clinic without AD    Time  4    Period  Weeks    Status  New    Target Date  11/05/18      PT SHORT TERM GOAL  #3   Title  improved 5x sit to stand to < 14 seconds to improve balance    Time  4    Period  Weeks    Status  New        PT Long Term Goals - 10/08/18 1607      PT LONG TERM GOAL #1   Title  Patient able to stand for 30 minutes to perform ADLS with 4/10 pain or less.    Time  8    Period  Weeks    Status  New    Target Date  12/03/18      PT LONG TERM GOAL #2   Title  Patient able to amb in community for 30 min or more with least restrictive AD and 4/10 pain  or less.    Time  8    Period  Weeks    Status  New      PT LONG TERM GOAL #3   Title  Pt to be independent in core strengthening HEP    Time  8    Period  Weeks    Status  New            Plan - 10/18/18 1143    Clinical Impression Statement  pt tolerated initial progression of ther ex well. performed all seated so no pain. educ pt as she gets stronger will progress to standing which is where pain occurs.     PT Treatment/Interventions  ADLs/Self Care Home Management;Cryotherapy;Electrical Stimulation;Traction;Moist Heat;Therapeutic exercise;Neuromuscular re-education;Patient/family education;Manual techniques;Dry needling;Gait training    PT Next Visit Plan  chcek and progress HEP       Patient will benefit from skilled therapeutic intervention in order to improve the following deficits and impairments:  Decreased activity tolerance, Decreased strength, Pain, Difficulty walking, Postural dysfunction  Visit Diagnosis: Acute bilateral low back pain, unspecified whether sciatica present  Other abnormalities of gait and mobility     Problem List There are no active problems to display for this patient.   PAYSEUR,ANGIE PTA 10/18/2018, 11:45 AM  [facility] [address] [place], [place], [address] Phone: [phone]   Fax:  [phone]  Name: SOREN PIGMAN MRN: [id number] Date of Birth: Sep 19, 1932

## 2018-10-30 ENCOUNTER — Ambulatory Visit: Payer: Medicare Other | Attending: Internal Medicine | Admitting: Physical Therapy

## 2018-10-30 DIAGNOSIS — R2689 Other abnormalities of gait and mobility: Secondary | ICD-10-CM | POA: Diagnosis present

## 2018-10-30 DIAGNOSIS — M545 Low back pain, unspecified: Secondary | ICD-10-CM

## 2018-10-30 NOTE — Therapy (Signed)
[place], [place], [address] Phone: [phone]   Fax:  [phone]  Physical Therapy Treatment  Patient Details  Name: Ashley Espinoza MRN: [id number] Date of Birth: 09/13/1932 Referring Provider (PT): Thomes Dinning, MD   Encounter Date: 10/30/2018  PT End of Session - 10/30/18 1600    Visit Number  3    Date for PT Re-Evaluation  12/03/18    PT Start Time  1520    PT Stop Time  1600    PT Time Calculation (min)  40 min    Activity Tolerance  Patient limited by pain    Behavior During Therapy  [facility] for tasks assessed/performed       Past Medical History:  Diagnosis Date  . Hypertension   . RLS (restless legs syndrome)   . Sleep apnea     No past surgical history on file.  There were no vitals filed for this visit.  Subjective Assessment - 10/30/18 1521    Subjective  "Iv been good, Iv been at the beach" "I thought I would be sore after last time but I wasnt"    Currently in Pain?  Yes    Pain Score  7     Pain Location  Knee    Pain Orientation  Left                       [facility] Adult PT Treatment/Exercise - 10/30/18 0001      Ambulation/Gait   Ambulation/Gait  Yes    Ambulation/Gait Assistance  6: Modified independent (Device/Increase time)    Ambulation Distance (Feet)  20 Feet    Gait Pattern  Decreased step length - right;Decreased step length - left    Ambulation Surface  Level      Exercises   Exercises  Lumbar      Lumbar Exercises: Aerobic   Nustep  L 4 7 min      Lumbar Exercises: Machines for Strengthening   Cybex Lumbar Extension  blue tband 2 sets 10      Lumbar Exercises: Standing   Other Standing Lumbar Exercises  Hip 3 way      Lumbar Exercises: Seated   Sit to Stand  5 reps   2x5 with UE support   Other Seated Lumbar Exercises  Tbdand HS curl, yellow Tband 2x10 bilateral    Other Seated Lumbar Exercises  Rows, shoulder ext,  horiz abd 2x10 yellow Tband               PT Short Term Goals - 10/30/18 1525      PT SHORT TERM GOAL #1   Title  Ind with initial HEP    Baseline  Forgot after I got back from beach but I did them before    Time  3    Period  Weeks    Status  Partially Met      PT SHORT TERM GOAL #2   Title  Patient able to ambulate safely in clinic without AD    Time  4    Period  Weeks    Status  On-going      PT SHORT TERM GOAL #3   Title  improved 5x sit to stand to < 14 seconds to improve balance    Baseline  with using UE 12.99 seconds    Time  4    Period  Weeks    Status  Partially Met        PT Long Term Goals - 10/08/18 1607      PT LONG TERM GOAL #1   Title  Patient able to stand for 30 minutes to perform ADLS with 9/37 pain or less.    Time  8    Period  Weeks    Status  New    Target Date  12/03/18      PT LONG TERM GOAL #2   Title  Patient able to amb in community for 30 min or more with least restrictive AD and 4/10 pain or less.    Time  8    Period  Weeks    Status  New      PT LONG TERM GOAL #3   Title  Pt to be independent in core strengthening HEP    Time  8    Period  Weeks    Status  New            Plan - 10/30/18 1600    Clinical Impression Statement  Pt tolerated progression of TE well without pain. Pt verbally reported no pain in back but had 7/10 pain in L knee. Pt preformed STS x 5 with UE support in 12.99 seconds. Pt requires close supervision with all standing exercises and ambulation to ensure safety. Pt demonstrated good form with all exercises and continues to progress appropriatly towards all goals at this time.     Rehab Potential  Good    PT Frequency  2x / week    PT Duration  8 weeks    PT Treatment/Interventions  ADLs/Self Care Home Management;Cryotherapy;Electrical Stimulation;Traction;Moist Heat;Therapeutic exercise;Neuromuscular re-education;Patient/family education;Manual techniques;Dry needling;Gait training    PT Next  Visit Plan  Continue adding exercises and focus on core stab    PT Home Exercise Plan  bridge, ab sets    Consulted and Agree with Plan of Care  Patient       Patient will benefit from skilled therapeutic intervention in order to improve the following deficits and impairments:  Decreased activity tolerance, Decreased strength, Pain, Difficulty walking, Postural dysfunction  Visit Diagnosis: Acute bilateral low back pain, unspecified whether sciatica present  Other abnormalities of gait and mobility     Problem List There are no active problems to display for this patient.   Howell Rucks, SPTA 10/30/2018, 4:05 PM  [place], [place], [address] Phone: [phone]   Fax:  [phone]  Name: NEREYDA BOWLER MRN: [id number] Date of Birth: 03/02/1932

## 2018-11-13 ENCOUNTER — Encounter: Payer: Self-pay | Admitting: Physical Therapy

## 2018-11-13 ENCOUNTER — Ambulatory Visit: Payer: Medicare Other | Admitting: Physical Therapy

## 2018-11-13 DIAGNOSIS — M545 Low back pain, unspecified: Secondary | ICD-10-CM

## 2018-11-13 DIAGNOSIS — R2689 Other abnormalities of gait and mobility: Secondary | ICD-10-CM

## 2018-11-13 NOTE — Therapy (Signed)
[place], [place], [address] Phone: [phone]   Fax:  [phone]  Physical Therapy Treatment  Patient Details  Name: Ashley Espinoza MRN: [id number] Date of Birth: 14-Jun-1932 Referring Provider (PT): Thomes Dinning, MD   Encounter Date: 11/13/2018  PT End of Session - 11/13/18 1512    Visit Number  4    Date for PT Re-Evaluation  12/03/18    PT Start Time  1430    PT Stop Time  1513    PT Time Calculation (min)  43 min    Activity Tolerance  Patient tolerated treatment well    Behavior During Therapy  [facility] for tasks assessed/performed       Past Medical History:  Diagnosis Date  . Hypertension   . RLS (restless legs syndrome)   . Sleep apnea     History reviewed. No pertinent surgical history.  There were no vitals filed for this visit.  Subjective Assessment - 11/13/18 1442    Subjective  Pt reports that she feels fine, Reports knee pain at times when getting up    Currently in Pain?  No/denies                       [facility] Adult PT Treatment/Exercise - 11/13/18 0001      Lumbar Exercises: Aerobic   Nustep  L 4 7 min      Lumbar Exercises: Seated   Sit to Stand  5 reps   Airex on mat table    Other Seated Lumbar Exercises  Tbdand HS curl, yellow Tband 2x10 bilateral    Other Seated Lumbar Exercises  Rows, shoulder ext, horiz abd 2x10 yellow Tband      Knee/Hip Exercises: Seated   Marching  20 reps;3 sets;Both               PT Short Term Goals - 10/30/18 1525      PT SHORT TERM GOAL #1   Title  Ind with initial HEP    Baseline  Forgot after I got back from beach but I did them before    Time  3    Period  Weeks    Status  Partially Met      PT SHORT TERM GOAL #2   Title  Patient able to ambulate safely in clinic without AD    Time  4    Period  Weeks    Status  On-going      PT SHORT TERM GOAL #3   Title  improved 5x sit to stand  to < 14 seconds to improve balance    Baseline  with using UE 12.99 seconds    Time  4    Period  Weeks    Status  Partially Met        PT Long Term Goals - 11/13/18 1514      PT LONG TERM GOAL #1   Title  Patient able to stand for 30 minutes to perform ADLS with 6/73 pain or less.    Status  Partially Met      PT LONG TERM GOAL #2   Title  Patient able to amb in community for 30 min or more with least restrictive AD and 4/10 pain or less.    Status  On-going      PT LONG TERM GOAL #3   Title  Pt to be independent in core strengthening HEP  Status  On-going            Plan - 11/13/18 1512    Clinical Impression Statement  Overall pt did well with today's exercises without reports of increase pain. Reports a catch in her R shoulder with horizontal abduction. Good ROM with light resisted band exercises.     Rehab Potential  Good    PT Frequency  2x / week    PT Duration  8 weeks    PT Treatment/Interventions  ADLs/Self Care Home Management;Cryotherapy;Electrical Stimulation;Traction;Moist Heat;Therapeutic exercise;Neuromuscular re-education;Patient/family education;Manual techniques;Dry needling;Gait training    PT Next Visit Plan  Continue adding exercises and focus on core stab       Patient will benefit from skilled therapeutic intervention in order to improve the following deficits and impairments:  Decreased activity tolerance, Decreased strength, Pain, Difficulty walking, Postural dysfunction  Visit Diagnosis: Acute bilateral low back pain, unspecified whether sciatica present  Other abnormalities of gait and mobility     Problem List There are no active problems to display for this patient.   Scot Jun, PTA 11/13/2018, 3:14 PM  [place], [place], [address] Phone: [phone]   Fax:  [phone]  Name: ELLEANOR GUYETT MRN: [id number] Date of Birth:  July 17, 1932

## 2018-12-25 ENCOUNTER — Ambulatory Visit: Payer: Medicare Other | Attending: Internal Medicine | Admitting: Physical Therapy

## 2018-12-25 ENCOUNTER — Encounter: Payer: Self-pay | Admitting: Physical Therapy

## 2018-12-25 DIAGNOSIS — M545 Low back pain, unspecified: Secondary | ICD-10-CM

## 2018-12-25 DIAGNOSIS — R2689 Other abnormalities of gait and mobility: Secondary | ICD-10-CM | POA: Diagnosis present

## 2018-12-25 NOTE — Therapy (Signed)
[place], [place], [address] Phone: [phone]   Fax:  [phone]  Physical Therapy Treatment  Patient Details  Name: Ashley Espinoza MRN: [id number] Date of Birth: 12-11-1931 Referring Provider (PT): Thomes Dinning, MD   Encounter Date: 12/25/2018    Past Medical History:  Diagnosis Date  . Hypertension   . RLS (restless legs syndrome)   . Sleep apnea     History reviewed. No pertinent surgical history.  There were no vitals filed for this visit.  Subjective Assessment - 12/25/18 1303    Subjective  "I have been feeling pretty good, My knees has been bothering me a lot "         [facility] PT Assessment - 12/25/18 0001      Posture/Postural Control   Posture Comments  left knee valgus resulting in depressed left hip and shoulder      AROM   Overall AROM Comments  lumbar WFL      Strength   Overall Strength Comments  grossly 5/5 in bil hips and knees except flex 4-/5      Transfers   Five time sit to stand comments   16 seconds                   [facility] Adult PT Treatment/Exercise - 12/25/18 0001      Lumbar Exercises: Aerobic   Nustep  L3 x7 min      Lumbar Exercises: Machines for Strengthening   Cybex Lumbar Extension  blue tband 2 sets 10      Lumbar Exercises: Seated   Other Seated Lumbar Exercises  Tbdand HS curl, yellow Tband 2x10 bilateral    Other Seated Lumbar Exercises  Rows, shoulder ext, horiz abd 2x10 yellow Tband      Knee/Hip Exercises: Standing   Other Standing Knee Exercises  Standing march with WR 2x10       Knee/Hip Exercises: Seated   Ball Squeeze  2x10               PT Short Term Goals - 10/30/18 1525      PT SHORT TERM GOAL #1   Title  Ind with initial HEP    Baseline  Forgot after I got back from beach but I did them before    Time  3    Period  Weeks    Status  Partially Met      PT SHORT TERM GOAL #2   Title   Patient able to ambulate safely in clinic without AD    Time  4    Period  Weeks    Status  On-going      PT SHORT TERM GOAL #3   Title  improved 5x sit to stand to < 14 seconds to improve balance    Baseline  with using UE 12.99 seconds    Time  4    Period  Weeks    Status  Partially Met        PT Long Term Goals - 11/13/18 1514      PT LONG TERM GOAL #1   Title  Patient able to stand for 30 minutes to perform ADLS with 6/96 pain or less.    Status  Partially Met      PT LONG TERM GOAL #2   Title  Patient able to amb in community for 30 min or more with least restrictive AD and 4/10 pain  or less.    Status  On-going      PT LONG TERM GOAL #3   Title  Pt to be independent in core strengthening HEP    Status  On-going            Plan - 12/25/18 1342    Clinical Impression Statement  Pt return to therapy after one month. She reports that this is due to holidays and transportation issues. She has good strength and ROM in LEs. She has several postural defects. Cues needed to sit up straight during therapy session. Pt reports increase back pain after prolong standing due to weak back and core muscles.    PT Frequency  2x / week    PT Duration  8 weeks    PT Treatment/Interventions  ADLs/Self Care Home Management;Cryotherapy;Electrical Stimulation;Traction;Moist Heat;Therapeutic exercise;Neuromuscular re-education;Patient/family education;Manual techniques;Dry needling;Gait training    PT Next Visit Plan  Continue adding exercises and focus on core stab       Patient will benefit from skilled therapeutic intervention in order to improve the following deficits and impairments:  Decreased activity tolerance, Decreased strength, Pain, Difficulty walking, Postural dysfunction  Visit Diagnosis: Acute bilateral low back pain, unspecified whether sciatica present  Other abnormalities of gait and mobility     Problem List There are no active problems to display for this  patient.   Scot Jun, PTA 12/25/2018, 1:55 PM  [place], [place], [address] Phone: [phone]   Fax:  [phone]  Name: Ashley Espinoza MRN: [id number] Date of Birth: [birth year]

## 2018-12-30 ENCOUNTER — Encounter: Payer: Self-pay | Admitting: Physical Therapy

## 2018-12-30 ENCOUNTER — Ambulatory Visit: Payer: Medicare Other | Attending: Internal Medicine | Admitting: Physical Therapy

## 2018-12-30 DIAGNOSIS — R2689 Other abnormalities of gait and mobility: Secondary | ICD-10-CM | POA: Insufficient documentation

## 2018-12-30 DIAGNOSIS — M545 Low back pain, unspecified: Secondary | ICD-10-CM

## 2018-12-30 NOTE — Therapy (Signed)
[place], [place], [address] Phone: [phone]   Fax:  [phone]  Physical Therapy Treatment  Patient Details  Name: Ashley Espinoza MRN: [id number] Date of Birth: 20-Aug-1932 Referring Provider (PT): Thomes Dinning, MD   Encounter Date: 12/30/2018  PT End of Session - 12/30/18 1427    Visit Number  6    Date for PT Re-Evaluation  01/25/19    PT Start Time  1359    PT Stop Time  1430    PT Time Calculation (min)  31 min    Activity Tolerance  Patient tolerated treatment well    Behavior During Therapy  [facility] for tasks assessed/performed       Past Medical History:  Diagnosis Date  . Hypertension   . RLS (restless legs syndrome)   . Sleep apnea     History reviewed. No pertinent surgical history.  There were no vitals filed for this visit.  Subjective Assessment - 12/30/18 1403    Subjective  "Can we not do anything with my knee, It started hurting over the weekend" Pt reports that her shoulder has been hurting because of the warm weather making it more humid.    Currently in Pain?  No/denies                       [facility] Adult PT Treatment/Exercise - 12/30/18 0001      Lumbar Exercises: Aerobic   UBE (Upper Arm Bike)  L1 2.5 each way      Lumbar Exercises: Seated   Other Seated Lumbar Exercises  Rows green tband, shoulder ext, horiz abd 2x15 yellow Tband      Knee/Hip Exercises: Seated   Marching  Both;1 set;10 reps    Hamstring Curl  Both;2 sets;10 reps    Hamstring Limitations  yellow Tband                PT Short Term Goals - 12/30/18 1427      PT SHORT TERM GOAL #1   Title  Ind with initial HEP    Status  Partially Met      PT SHORT TERM GOAL #2   Title  Patient able to ambulate safely in clinic without AD    Status  Partially Met        PT Long Term Goals - 11/13/18 1514      PT LONG TERM GOAL #1   Title  Patient able to stand for  30 minutes to perform ADLS with 8/76 pain or less.    Status  Partially Met      PT LONG TERM GOAL #2   Title  Patient able to amb in community for 30 min or more with least restrictive AD and 4/10 pain or less.    Status  On-going      PT LONG TERM GOAL #3   Title  Pt to be independent in core strengthening HEP    Status  On-going            Plan - 12/30/18 1428    Clinical Impression Statement  Pt ~ 14 minutes late for today's treatment. she enters clinic reporting that she has been having difficulty with her L knee and wanted to avoid using it. She did well with all seated exercises. No increase in pain with seated HS curs. Pt even tolerated more resistance with seated rows.     Rehab Potential  Good    PT Frequency  2x / week    PT Duration  8 weeks    PT Treatment/Interventions  ADLs/Self Care Home Management;Cryotherapy;Electrical Stimulation;Traction;Moist Heat;Therapeutic exercise;Neuromuscular re-education;Patient/family education;Manual techniques;Dry needling;Gait training    PT Next Visit Plan  Continue adding exercises and focus on core stab       Patient will benefit from skilled therapeutic intervention in order to improve the following deficits and impairments:  Decreased activity tolerance, Decreased strength, Pain, Difficulty walking, Postural dysfunction  Visit Diagnosis: Acute bilateral low back pain, unspecified whether sciatica present  Other abnormalities of gait and mobility     Problem List There are no active problems to display for this patient.   Scot Jun, PTA 12/30/2018, 2:31 PM  [place], [place], [address] Phone: [phone]   Fax:  [phone]  Name: Ashley Espinoza MRN: [id number] Date of Birth: 1932-07-30

## 2019-01-01 ENCOUNTER — Encounter: Payer: Medicare Other | Admitting: Physical Therapy

## 2019-01-07 ENCOUNTER — Ambulatory Visit: Payer: Medicare Other | Admitting: Physical Therapy

## 2019-01-07 ENCOUNTER — Encounter: Payer: Self-pay | Admitting: Physical Therapy

## 2019-01-07 DIAGNOSIS — R2689 Other abnormalities of gait and mobility: Secondary | ICD-10-CM

## 2019-01-07 DIAGNOSIS — M545 Low back pain, unspecified: Secondary | ICD-10-CM

## 2019-01-07 NOTE — Therapy (Signed)
[place], [place], [address] Phone: [phone]   Fax:  [phone]  Physical Therapy Treatment  Patient Details  Name: Ashley Espinoza MRN: [id number] Date of Birth: May 26, 1932 Referring Provider (PT): Thomes Dinning, MD   Encounter Date: 01/07/2019  PT End of Session - 01/07/19 1510    Visit Number  7    Date for PT Re-Evaluation  01/25/19    PT Start Time  1430    PT Stop Time  1510    PT Time Calculation (min)  40 min       Past Medical History:  Diagnosis Date  . Hypertension   . RLS (restless legs syndrome)   . Sleep apnea     History reviewed. No pertinent surgical history.  There were no vitals filed for this visit.  Subjective Assessment - 01/07/19 1434    Subjective  Pt reports that she is doing fine    Currently in Pain?  No/denies         [facility] PT Assessment - 01/07/19 0001      Assessment   Medical Diagnosis  lumbar radiculopathy    Referring Provider (PT)  Thomes Dinning, MD    Onset Date/Surgical Date  08/27/18                   [facility] PT Treatment/Exercise - 01/07/19 0001      Lumbar Exercises: Aerobic   Nustep  L3 x7 min      Lumbar Exercises: Supine   Ab Set  10 reps;2 seconds    Bridge  10 reps;Compliant;2 seconds   x2   Straight Leg Raise  10 reps;2 seconds    Other Supine Lumbar Exercises  neck retractions 2x10     Other Supine Lumbar Exercises  hooklying marches 2x10      Knee/Hip Exercises: Seated   Ball Squeeze  2x10    Hamstring Curl  Both;2 sets;10 reps    Hamstring Limitations  yellow Tband                PT Short Term Goals - 12/30/18 1427      PT SHORT TERM GOAL #1   Title  Ind with initial HEP    Status  Partially Met      PT SHORT TERM GOAL #2   Title  Patient able to ambulate safely in clinic without AD    Status  Partially Met        PT Long Term Goals - 01/07/19 1437      PT LONG  TERM GOAL #1   Title  Patient able to stand for 30 minutes to perform ADLS with 3/00 pain or less.    Status  Partially Met      PT LONG TERM GOAL #2   Title  Patient able to amb in community for 30 min or more with least restrictive AD and 4/10 pain or less.    Status  Partially Met            Plan - 01/07/19 1510    Clinical Impression Statement  Pt is progressing some towards goals, she still reports some LBP after about 20 minutes of standing. Pt also reports some neck pain. She enters clinic reporting ness pain compared to last visit. Tactile cues needed for core contraction with ab sets. She did well transitioning from supine to sit. Good HS strength with curls.  PT Frequency  2x / week    PT Treatment/Interventions  ADLs/Self Care Home Management;Cryotherapy;Electrical Stimulation;Traction;Moist Heat;Therapeutic exercise;Neuromuscular re-education;Patient/family education;Manual techniques;Dry needling;Gait training    PT Home Exercise Plan  bridge, SLR, hooklying and marching       Patient will benefit from skilled therapeutic intervention in order to improve the following deficits and impairments:  Decreased activity tolerance, Decreased strength, Pain, Difficulty walking, Postural dysfunction  Visit Diagnosis: Acute bilateral low back pain, unspecified whether sciatica present  Other abnormalities of gait and mobility     Problem List There are no active problems to display for this patient.   Scot Jun, PTA 01/07/2019, 3:13 PM  [place], [place], [address] Phone: [phone]   Fax:  [phone]  Name: Ashley Espinoza MRN: [id number] Date of Birth: May 09, 1932

## 2019-01-13 ENCOUNTER — Encounter: Payer: Self-pay | Admitting: Physical Therapy

## 2019-01-13 ENCOUNTER — Ambulatory Visit: Payer: Medicare Other | Admitting: Physical Therapy

## 2019-01-13 DIAGNOSIS — M545 Low back pain, unspecified: Secondary | ICD-10-CM

## 2019-01-13 DIAGNOSIS — R2689 Other abnormalities of gait and mobility: Secondary | ICD-10-CM

## 2019-01-13 NOTE — Therapy (Signed)
[place], [place], [address] Phone: [phone]   Fax:  [phone]  Physical Therapy Treatment  Patient Details  Name: Ashley Espinoza MRN: [id number] Date of Birth: Aug 08, 1932 Referring Provider (PT): Thomes Dinning, MD   Encounter Date: 01/13/2019  PT End of Session - 01/13/19 1512    Visit Number  8    Date for PT Re-Evaluation  01/25/19    PT Start Time  2229    PT Stop Time  1515    PT Time Calculation (min)  39 min    Activity Tolerance  Patient tolerated treatment well    Behavior During Therapy  [facility] for tasks assessed/performed       Past Medical History:  Diagnosis Date  . Hypertension   . RLS (restless legs syndrome)   . Sleep apnea     History reviewed. No pertinent surgical history.  There were no vitals filed for this visit.  Subjective Assessment - 01/13/19 1437    Subjective  "I feel a little breathless since lunch"    Currently in Pain?  Yes    Pain Score  2     Pain Location  Knee    Pain Orientation  Left                       [facility] Adult PT Treatment/Exercise - 01/13/19 0001      Lumbar Exercises: Aerobic   Nustep  L3 x7 min      Lumbar Exercises: Standing   Row  Strengthening;15 reps;Theraband;Both    Theraband Level (Row)  Level 3 (Green)    Shoulder Extension  Theraband;20 reps;Both;Strengthening    Theraband Level (Shoulder Extension)  Level 3 (Green)      Lumbar Exercises: Seated   Sit to Stand  10 reps;5 reps   RUE assist, airex on mat table   Other Seated Lumbar Exercises  Ab sets 2x10      Knee/Hip Exercises: Standing   Other Standing Knee Exercises  Standing march with WR 2x10       Knee/Hip Exercises: Seated   Long Arc Quad  Both;2 sets;10 reps    Long Arc Quad Weight  2 lbs.    Ball Squeeze  2x10    Hamstring Curl  Both;2 sets;10 reps    Hamstring Limitations  red tband                PT Short Term  Goals - 12/30/18 1427      PT SHORT TERM GOAL #1   Title  Ind with initial HEP    Status  Partially Met      PT SHORT TERM GOAL #2   Title  Patient able to ambulate safely in clinic without AD    Status  Partially Met        PT Long Term Goals - 01/07/19 1437      PT LONG TERM GOAL #1   Title  Patient able to stand for 30 minutes to perform ADLS with 7/98 pain or less.    Status  Partially Met      PT LONG TERM GOAL #2   Title  Patient able to amb in community for 30 min or more with least restrictive AD and 4/10 pain or less.    Status  Partially Met            Plan - 01/13/19 1513    Clinical  Impression Statement  Pt ~ 6 minute late for today's treatment session. Pt enters clinic reporting some knee pain. Despite pain pt able to complete all of today's exercises. Some increase in knee pain wiht sit to stands. Pt able to stand, stabilize, and perform rows and extensions.Tessie Fass elevation with standing march.    Rehab Potential  Good    PT Treatment/Interventions  ADLs/Self Care Home Management;Cryotherapy;Electrical Stimulation;Traction;Moist Heat;Therapeutic exercise;Neuromuscular re-education;Patient/family education;Manual techniques;Dry needling;Gait training    PT Next Visit Plan  Continue adding exercises and focus on core stab       Patient will benefit from skilled therapeutic intervention in order to improve the following deficits and impairments:  Decreased activity tolerance, Decreased strength, Pain, Difficulty walking, Postural dysfunction  Visit Diagnosis: Other abnormalities of gait and mobility  Acute bilateral low back pain, unspecified whether sciatica present     Problem List There are no active problems to display for this patient.   Scot Jun, PTA 01/13/2019, 3:19 PM  [place], [place], [address] Phone: [phone]   Fax:  [phone]  Name:  Ashley Espinoza MRN: [id number] Date of Birth: Oct 21, 1932

## 2019-01-15 ENCOUNTER — Ambulatory Visit: Payer: Medicare Other | Admitting: Physical Therapy

## 2019-01-15 ENCOUNTER — Encounter: Payer: Self-pay | Admitting: Physical Therapy

## 2019-01-15 DIAGNOSIS — M545 Low back pain, unspecified: Secondary | ICD-10-CM

## 2019-01-15 DIAGNOSIS — R2689 Other abnormalities of gait and mobility: Secondary | ICD-10-CM

## 2019-01-15 NOTE — Therapy (Signed)
[place], [place], [address] Phone: [phone]   Fax:  [phone]  Physical Therapy Treatment  Patient Details  Name: Ashley Espinoza MRN: [id number] Date of Birth: 11/01/1932 Referring Provider (PT): Thomes Dinning, MD   Encounter Date: 01/15/2019  PT End of Session - 01/15/19 1515    Visit Number  9    Date for PT Re-Evaluation  01/25/19    PT Start Time  1430    PT Stop Time  1514    PT Time Calculation (min)  44 min    Activity Tolerance  Patient tolerated treatment well    Behavior During Therapy  [facility] for tasks assessed/performed       Past Medical History:  Diagnosis Date  . Hypertension   . RLS (restless legs syndrome)   . Sleep apnea     History reviewed. No pertinent surgical history.  There were no vitals filed for this visit.  Subjective Assessment - 01/15/19 1434    Subjective  "Well my knees are bothering my, I think it is  the weather"    Currently in Pain?  Yes    Pain Score  7     Pain Location  Knee    Pain Orientation  Left                       [facility] Adult PT Treatment/Exercise - 01/15/19 0001      Lumbar Exercises: Aerobic   Nustep  L3 x7 min      Lumbar Exercises: Standing   Row  Strengthening;15 reps;Theraband;Both    Theraband Level (Row)  Level 3 (Green)    Other Standing Lumbar Exercises  OHP yellow ball 2x10       Lumbar Exercises: Seated   Sit to Stand  10 reps;5 reps   RUE assist, Airex on mat table   Other Seated Lumbar Exercises  Ab sets 2x10      Knee/Hip Exercises: Machines for Strengthening   Cybex Knee Extension  5lb 2x10    Cybex Knee Flexion  25lb x10   L knee pain reported      Knee/Hip Exercises: Standing   Other Standing Knee Exercises  Standing march with WR 2x10       Knee/Hip Exercises: Seated   Ball Squeeze  2x10    Hamstring Curl  Both;2 sets;10 reps    Hamstring Limitations  red tband                 PT Short Term Goals - 12/30/18 1427      PT SHORT TERM GOAL #1   Title  Ind with initial HEP    Status  Partially Met      PT SHORT TERM GOAL #2   Title  Patient able to ambulate safely in clinic without AD    Status  Partially Met        PT Long Term Goals - 01/15/19 1516      PT LONG TERM GOAL #1   Title  Patient able to stand for 30 minutes to perform ADLS with 3/81 pain or less.    Status  Partially Met            Plan - 01/15/19 1519    Clinical Impression Statement  Attempted leg curls on machine but had to stop due to L knee pain. Pt was able to progress to more core interventions in standing  without pain. Some L knee pain reported with sit to stands. All other interventions completed well.    Rehab Potential  Good    PT Next Visit Plan  Continue adding exercises and focus on core stab       Patient will benefit from skilled therapeutic intervention in order to improve the following deficits and impairments:  Decreased activity tolerance, Decreased strength, Pain, Difficulty walking, Postural dysfunction  Visit Diagnosis: Acute bilateral low back pain, unspecified whether sciatica present  Other abnormalities of gait and mobility     Problem List There are no active problems to display for this patient.   Scot Jun 01/15/2019, 3:21 PM  [place], [place], [address] Phone: [phone]   Fax:  [phone]  Name: Ashley Espinoza MRN: [id number] Date of Birth: 30-Apr-1932

## 2019-01-22 ENCOUNTER — Ambulatory Visit: Payer: Medicare Other | Admitting: Physical Therapy

## 2019-01-22 ENCOUNTER — Encounter: Payer: Self-pay | Admitting: Physical Therapy

## 2019-01-22 DIAGNOSIS — M545 Low back pain, unspecified: Secondary | ICD-10-CM

## 2019-01-22 DIAGNOSIS — R2689 Other abnormalities of gait and mobility: Secondary | ICD-10-CM

## 2019-01-22 NOTE — Therapy (Addendum)
[place], [place], [address] Phone: [phone]   Fax:  [phone] Progress Note Reporting Period 10/09/19 to 01/22/19 for the first 10 visits  See note below for Objective Data and Assessment of Progress/Goals.      Physical Therapy Treatment  Patient Details  Name: Ashley Espinoza MRN: [id number] Date of Birth: 1932-06-08 Referring Provider (PT): Thomes Dinning, MD   Encounter Date: 01/22/2019  PT End of Session - 01/22/19 1555    Visit Number  10    Date for PT Re-Evaluation  01/25/19    PT Start Time  1520    PT Stop Time  1600    PT Time Calculation (min)  40 min    Activity Tolerance  Patient tolerated treatment well    Behavior During Therapy  [facility] for tasks assessed/performed       Past Medical History:  Diagnosis Date  . Hypertension   . RLS (restless legs syndrome)   . Sleep apnea     History reviewed. No pertinent surgical history.  There were no vitals filed for this visit.  Subjective Assessment - 01/22/19 1521    Subjective  "My knee has been bothering me, I started putting heat on it yesterday and it helped"    Currently in Pain?  Yes    Pain Score  3     Pain Location  Knee    Pain Orientation  Left                       [facility] Adult PT Treatment/Exercise - 01/22/19 0001      Lumbar Exercises: Aerobic   Nustep  L3 x7 min      Lumbar Exercises: Seated   Long Arc Quad on Chair  2 sets;10 reps;Both    Other Seated Lumbar Exercises  Ball pick up to overhead throw 3x5; AB set with Pball 2x10    Other Seated Lumbar Exercises  Rows green tband, shoulder ext, horiz abd 2x15 yellow Tband      Knee/Hip Exercises: Seated   Marching  Both;1 set;10 reps               PT Short Term Goals - 12/30/18 1427      PT SHORT TERM GOAL #1   Title  Ind with initial HEP    Status  Partially Met      PT SHORT TERM GOAL #2   Title  Patient able  to ambulate safely in clinic without AD    Status  Partially Met        PT Long Term Goals - 01/15/19 1516      PT LONG TERM GOAL #1   Title  Patient able to stand for 30 minutes to perform ADLS with 1/70 pain or less.    Status  Partially Met            Plan - 01/22/19 1559    Clinical Impression Statement  Pt requested seated knee exercises due to knee pain. Pt with some difficulty with overhead ball toss. Postural cues needed with seated rows. She did reports some R shoulder pain with horizontal abduction.    PT Frequency  2x / week    PT Duration  8 weeks    PT Next Visit Plan  Continue adding exercises and focus on core stab, DC tomorrow       Patient will benefit from skilled therapeutic intervention  in order to improve the following deficits and impairments:  Decreased activity tolerance, Decreased strength, Pain, Difficulty walking, Postural dysfunction  Visit Diagnosis: Acute bilateral low back pain, unspecified whether sciatica present  Other abnormalities of gait and mobility     Problem List There are no active problems to display for this patient.   Scot Jun, PTA 01/22/2019, 4:00 PM  [place], [place], [address] Phone: [phone]   Fax:  [phone]  Name: Ashley Espinoza MRN: [id number] Date of Birth: 1932-01-02

## 2019-01-23 ENCOUNTER — Encounter: Payer: Self-pay | Admitting: Physical Therapy

## 2019-01-23 ENCOUNTER — Ambulatory Visit: Payer: Medicare Other | Admitting: Physical Therapy

## 2019-01-23 DIAGNOSIS — M545 Low back pain, unspecified: Secondary | ICD-10-CM

## 2019-01-23 DIAGNOSIS — R2689 Other abnormalities of gait and mobility: Secondary | ICD-10-CM

## 2019-01-23 NOTE — Therapy (Signed)
[facility] [place] [place], [place], [address] Phone: [phone]   Fax:  [phone]  Physical Therapy Treatment  Patient Details  Name: Ashley Espinoza MRN: [id number] Date of Birth: Jan 11, 1932 Referring Provider (PT): Thomes Dinning, MD   Encounter Date: 01/23/2019  PT End of Session - 01/23/19 1554    Visit Number  11    Date for PT Re-Evaluation  01/25/19    PT Start Time  6256    PT Stop Time  1555    PT Time Calculation (min)  40 min    Activity Tolerance  Patient tolerated treatment well    Behavior During Therapy  [facility] for tasks assessed/performed       Past Medical History:  Diagnosis Date  . Hypertension   . RLS (restless legs syndrome)   . Sleep apnea     History reviewed. No pertinent surgical history.  There were no vitals filed for this visit.  Subjective Assessment - 01/23/19 1522    Subjective  "I am doing fine"    Currently in Pain?  Yes    Pain Score  2     Pain Location  Knee    Pain Orientation  Left                       [facility] Adult PT Treatment/Exercise - 01/23/19 0001      Lumbar Exercises: Aerobic   Nustep  L4 x7 min      Lumbar Exercises: Seated   Other Seated Lumbar Exercises  Rows green tband, shoulder ext, horiz abd 2x15 red Tband      Knee/Hip Exercises: Seated   Marching  Both;10 reps;2 sets;Weights    Marching Weights  2 lbs.    Hamstring Curl  Both;2 sets;10 reps    Hamstring Limitations  green tband               PT Short Term Goals - 12/30/18 1427      PT SHORT TERM GOAL #1   Title  Ind with initial HEP    Status  Partially Met      PT SHORT TERM GOAL #2   Title  Patient able to ambulate safely in clinic without AD    Status  Partially Met        PT Long Term Goals - 01/23/19 1549      PT LONG TERM GOAL #1   Title  Patient able to stand for 30 minutes to perform ADLS with 3/89 pain or less.    Status  Partially  Met      PT LONG TERM GOAL #2   Title  Patient able to amb in community for 30 min or more with least restrictive AD and 4/10 pain or less.    Status  Achieved      PT LONG TERM GOAL #3   Title  Pt to be independent in core strengthening HEP    Status  Achieved            Plan - 01/23/19 1554    Clinical Impression Statement  Pt has progressed, most goals met. Again pt requested to do all seated exercises. Postural cues needed with horizontal abduction. She reports improved function at home     Rehab Potential  Good    PT Frequency  2x / week    PT Duration  8 weeks    PT Treatment/Interventions  ADLs/Self Care  Home Management;Cryotherapy;Electrical Stimulation;Traction;Moist Heat;Therapeutic exercise;Neuromuscular re-education;Patient/family education;Manual techniques;Dry needling;Gait training    PT Next Visit Plan  D/C PT       Patient will benefit from skilled therapeutic intervention in order to improve the following deficits and impairments:  Decreased activity tolerance, Decreased strength, Pain, Difficulty walking, Postural dysfunction  Visit Diagnosis: Other abnormalities of gait and mobility  Acute bilateral low back pain, unspecified whether sciatica present     Problem List There are no active problems to display for this patient.  PHYSICAL THERAPY DISCHARGE SUMMARY  Visits from Start of Care: 11  Plan: Patient agrees to discharge.  Patient goals were partially met. Patient is being discharged due to being pleased with the current functional level.  ?????     Scot Jun, PTA 01/23/2019, 3:56 PM  [address], [place], [address] Phone: [phone]   Fax:  [phone]  Name: Ashley Espinoza MRN: [id number] Date of Birth: Nov 09, 1932

## 2020-01-02 IMAGING — DX DG LUMBAR SPINE COMPLETE 4+V
5 series · 5 of 5 positions shown · non-contrast
Comparison: None.

CLINICAL DATA: Low back and severe right lower extremity pain for 2
days. No known injury.

EXAM:
LUMBAR SPINE - COMPLETE 4+ VIEW

[l-spine obl (1 of 2)]
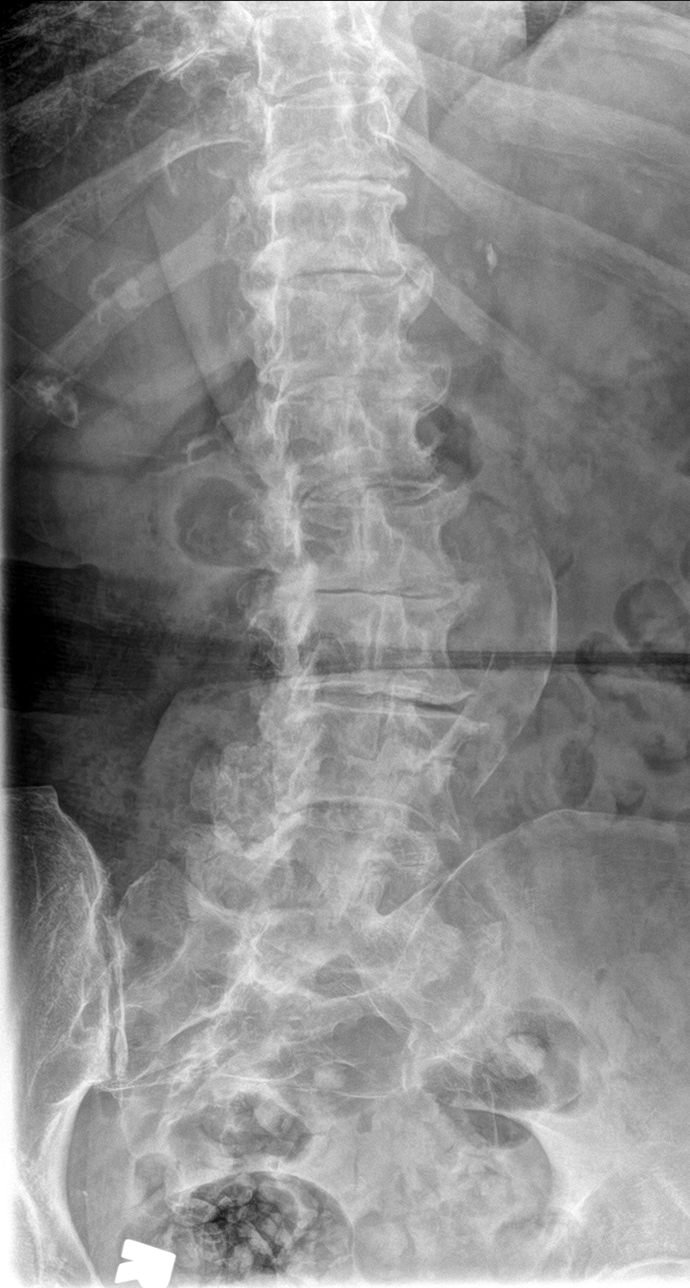

[l-spine lat]
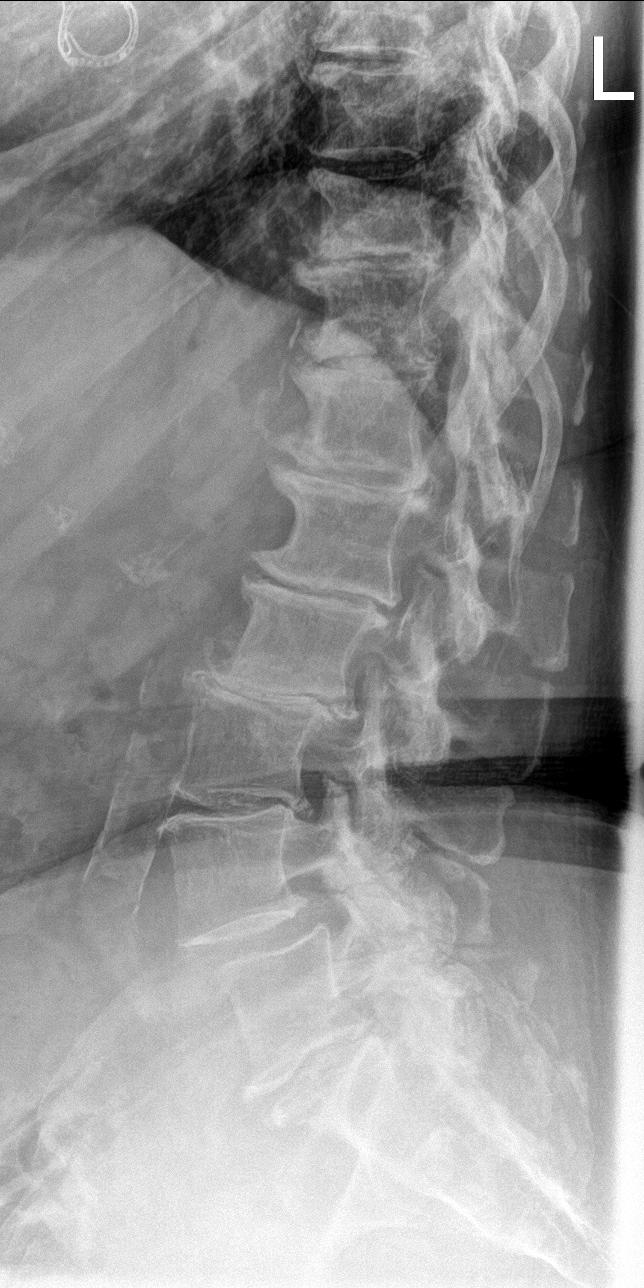

[l-spine spot]
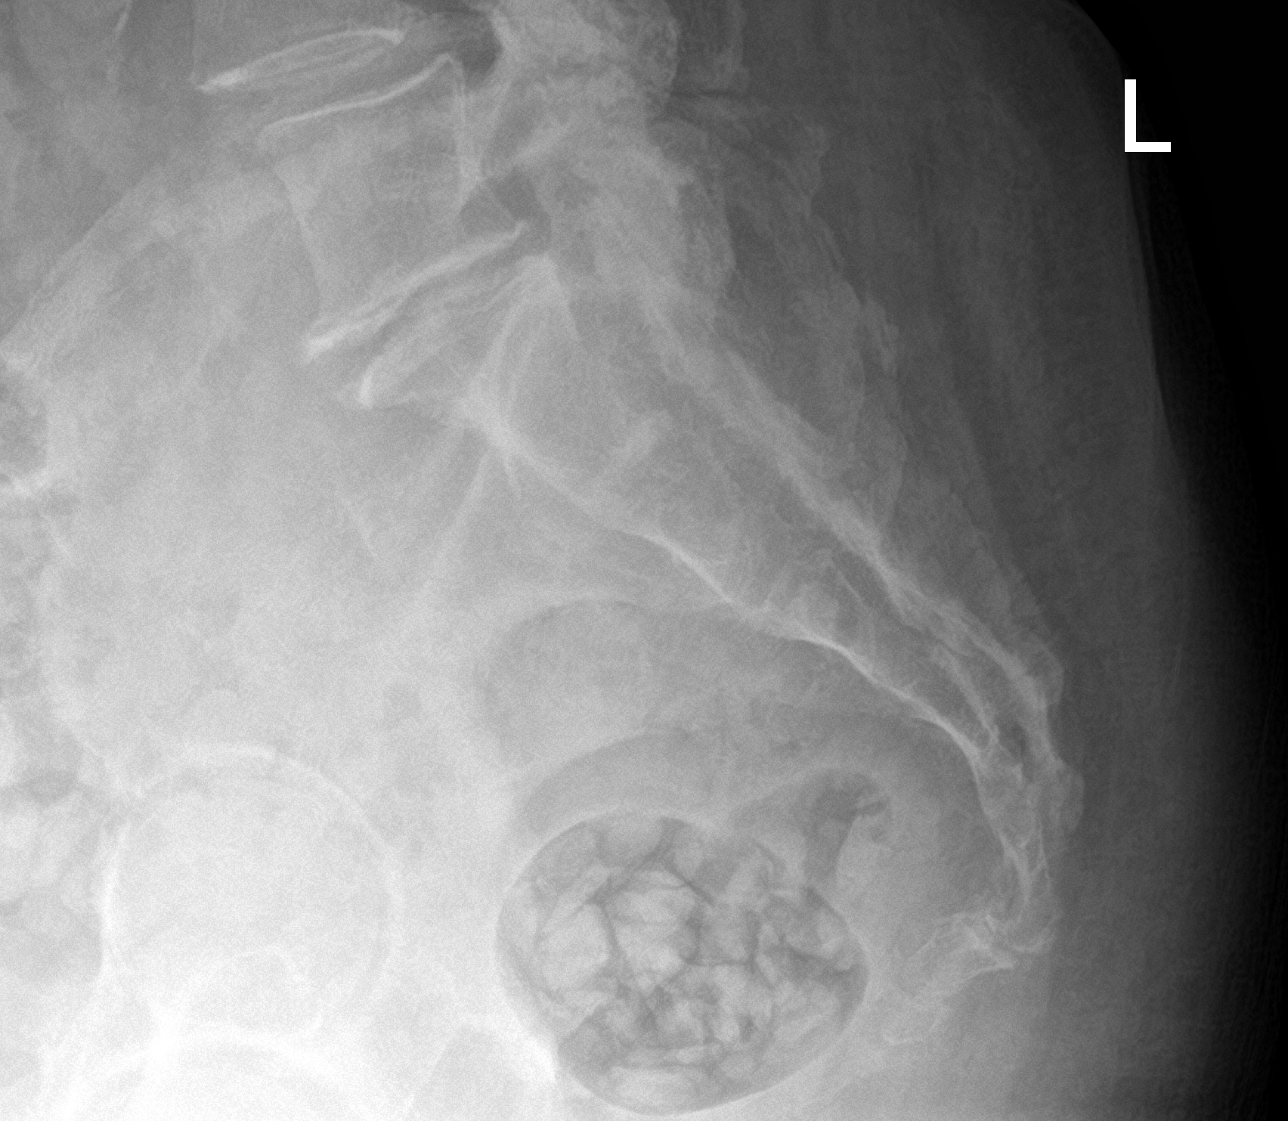

[l-spine ap]
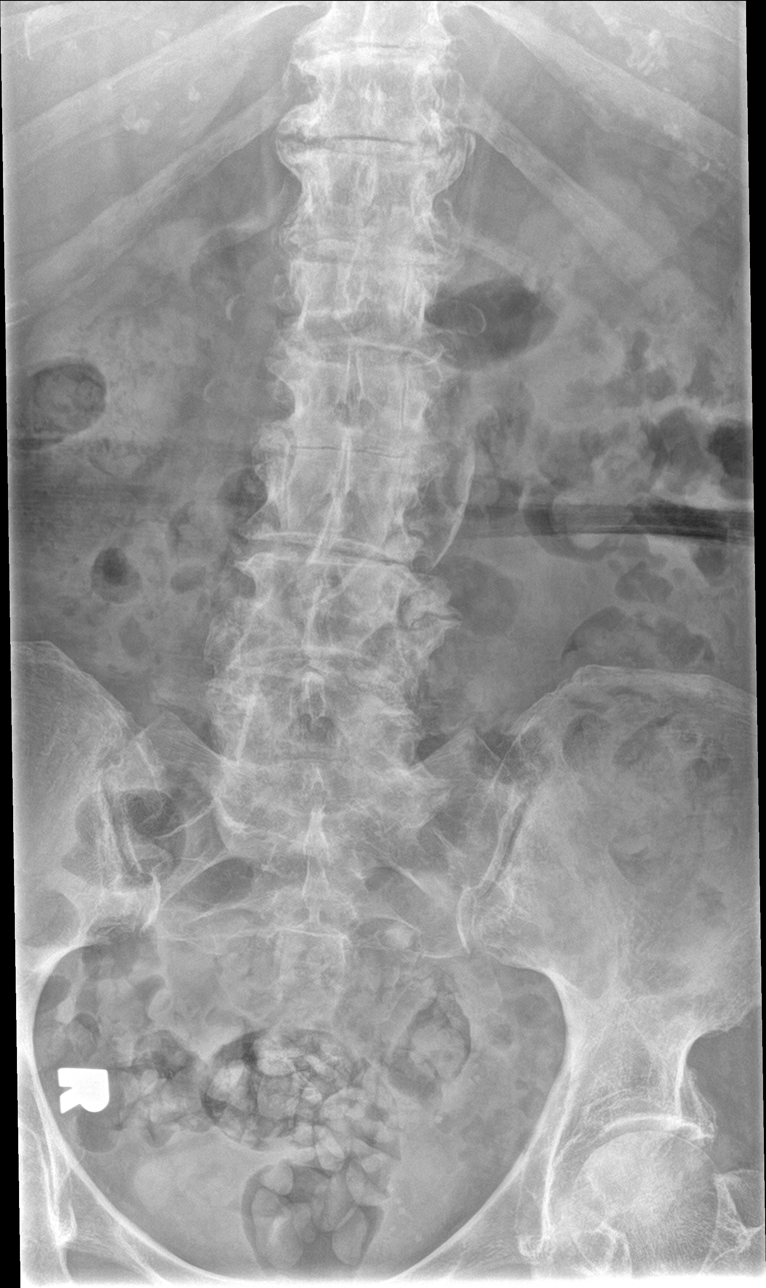

[l-spine obl (2 of 2)]
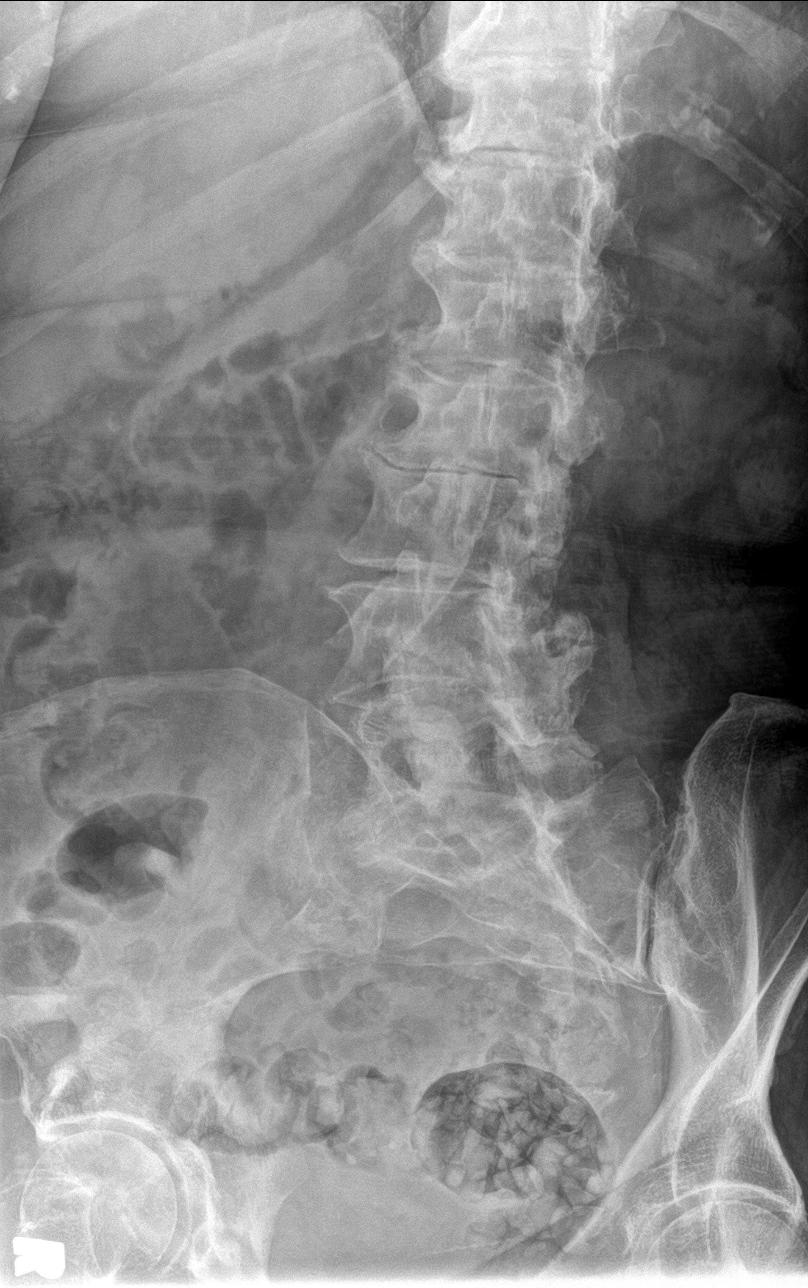

[5 of 5 positions shown; findings below may reference images not displayed]

FINDINGS: No fracture is identified. The patient has severe multilevel
degenerative disease with marked loss of disc space height from
T10-L4 and at L5-S1. Multilevel facet arthropathy is worst at L4-5
and L5-S1 and results in 0.6 cm anterolisthesis L4 on L5.
Paraspinous structures demonstrate aortoiliac atherosclerosis.
IMPRESSION: No acute abnormality.

Advanced multilevel degenerative disease.

Atherosclerosis.

## 2022-05-09 ENCOUNTER — Encounter (HOSPITAL_BASED_OUTPATIENT_CLINIC_OR_DEPARTMENT_OTHER): Payer: Self-pay | Admitting: Emergency Medicine

## 2022-05-09 ENCOUNTER — Other Ambulatory Visit: Payer: Self-pay

## 2022-05-09 ENCOUNTER — Emergency Department (HOSPITAL_BASED_OUTPATIENT_CLINIC_OR_DEPARTMENT_OTHER)
Admission: EM | Admit: 2022-05-09 | Discharge: 2022-05-09 | Disposition: A | Discharge status: Home / Self Care | Payer: Medicare Other | Attending: Emergency Medicine | Admitting: Emergency Medicine

## 2022-05-09 ENCOUNTER — Emergency Department (HOSPITAL_BASED_OUTPATIENT_CLINIC_OR_DEPARTMENT_OTHER): Payer: Medicare Other

## 2022-05-09 DIAGNOSIS — Z79899 Other long term (current) drug therapy: Secondary | ICD-10-CM | POA: Diagnosis not present

## 2022-05-09 DIAGNOSIS — R630 Anorexia: Secondary | ICD-10-CM | POA: Diagnosis not present

## 2022-05-09 DIAGNOSIS — R002 Palpitations: Secondary | ICD-10-CM | POA: Insufficient documentation

## 2022-05-09 DIAGNOSIS — R202 Paresthesia of skin: Secondary | ICD-10-CM | POA: Diagnosis not present

## 2022-05-09 DIAGNOSIS — I129 Hypertensive chronic kidney disease with stage 1 through stage 4 chronic kidney disease, or unspecified chronic kidney disease: Secondary | ICD-10-CM | POA: Diagnosis not present

## 2022-05-09 DIAGNOSIS — R110 Nausea: Secondary | ICD-10-CM | POA: Diagnosis not present

## 2022-05-09 DIAGNOSIS — N189 Chronic kidney disease, unspecified: Secondary | ICD-10-CM | POA: Insufficient documentation

## 2022-05-09 DIAGNOSIS — R5383 Other fatigue: Secondary | ICD-10-CM | POA: Insufficient documentation

## 2022-05-09 DIAGNOSIS — E860 Dehydration: Secondary | ICD-10-CM | POA: Diagnosis not present

## 2022-05-09 LAB — URINALYSIS, ROUTINE W REFLEX MICROSCOPIC
Bilirubin Urine: NEGATIVE
Glucose, UA: NEGATIVE mg/dL
Hgb urine dipstick: NEGATIVE
Ketones, ur: NEGATIVE mg/dL
Nitrite: NEGATIVE
Protein, ur: NEGATIVE mg/dL
Specific Gravity, Urine: 1.01 (ref 1.005–1.030)
pH: 7.5 (ref 5.0–8.0)

## 2022-05-09 LAB — URINALYSIS, MICROSCOPIC (REFLEX)

## 2022-05-09 LAB — COMPREHENSIVE METABOLIC PANEL
ALT: 19 U/L (ref 0–44)
AST: 19 U/L (ref 15–41)
Albumin: 3.9 g/dL (ref 3.5–5.0)
Alkaline Phosphatase: 61 U/L (ref 38–126)
Anion gap: 8 (ref 5–15)
BUN: 42 mg/dL — ABNORMAL HIGH (ref 8–23)
CO2: 24 mmol/L (ref 22–32)
Calcium: 9.9 mg/dL (ref 8.9–10.3)
Chloride: 106 mmol/L (ref 98–111)
Creatinine, Ser: 1.34 mg/dL — ABNORMAL HIGH (ref 0.44–1.00)
GFR, Estimated: 38 mL/min — ABNORMAL LOW (ref >60)
Glucose, Bld: 117 mg/dL — ABNORMAL HIGH (ref 70–99)
Potassium: 4 mmol/L (ref 3.5–5.1)
Sodium: 138 mmol/L (ref 135–145)
Total Bilirubin: 0.6 mg/dL (ref 0.3–1.2)
Total Protein: 7.7 g/dL (ref 6.5–8.1)

## 2022-05-09 LAB — CBC WITH DIFFERENTIAL/PLATELET
Abs Immature Granulocytes: 0.03 10*3/uL (ref 0.00–0.07)
Basophils Absolute: 0 10*3/uL (ref 0.0–0.1)
Basophils Relative: 1 %
Eosinophils Absolute: 0.2 10*3/uL (ref 0.0–0.5)
Eosinophils Relative: 2 %
HCT: 45 % (ref 36.0–46.0)
Hemoglobin: 15.3 g/dL — ABNORMAL HIGH (ref 12.0–15.0)
Immature Granulocytes: 0 %
Lymphocytes Relative: 11 %
Lymphs Abs: 1 10*3/uL (ref 0.7–4.0)
MCH: 28 pg (ref 26.0–34.0)
MCHC: 34 g/dL (ref 30.0–36.0)
MCV: 82.3 fL (ref 80.0–100.0)
Monocytes Absolute: 0.4 10*3/uL (ref 0.1–1.0)
Monocytes Relative: 5 %
Neutro Abs: 6.7 10*3/uL (ref 1.7–7.7)
Neutrophils Relative %: 81 %
Platelets: 242 10*3/uL (ref 150–400)
RBC: 5.47 MIL/uL — ABNORMAL HIGH (ref 3.87–5.11)
RDW: 12.6 % (ref 11.5–15.5)
WBC: 8.4 10*3/uL (ref 4.0–10.5)
nRBC: 0 % (ref 0.0–0.2)

## 2022-05-09 LAB — TROPONIN I (HIGH SENSITIVITY)
Troponin I (High Sensitivity): 9 ng/L (ref <18)
Troponin I (High Sensitivity): 10 ng/L (ref <18)

## 2022-05-09 MED ORDER — AMLODIPINE BESYLATE 5 MG PO TABS
10.0000 mg | ORAL_TABLET | Freq: Every day | ORAL | 0 refills | Status: DC
Start: 2022-05-09 — End: 2022-05-09

## 2022-05-09 MED ORDER — HYDRALAZINE HCL 10 MG PO TABS: 10.0000 mg | ORAL_TABLET | Freq: Once | ORAL | Status: DC

## 2022-05-09 MED ORDER — AMLODIPINE BESYLATE 5 MG PO TABS: 5.0000 mg | ORAL_TABLET | Freq: Every day | ORAL | 0 refills | Status: AC

## 2022-05-09 MED ORDER — AMLODIPINE BESYLATE 5 MG PO TABS
5.0000 mg | ORAL_TABLET | Freq: Once | ORAL | Status: AC
  Administered 2022-05-09: 5 mg via ORAL
  Filled 2022-05-09: qty 1

## 2022-05-09 MED ORDER — CEPHALEXIN 500 MG PO CAPS: 500.0000 mg | ORAL_CAPSULE | Freq: Two times a day (BID) | ORAL | 0 refills | Status: AC

## 2022-05-09 NOTE — ED Notes (Signed)
Pt states has restless leg and wanted to take her meds for that , ok per Dr Dalene Seltzer

## 2022-05-09 NOTE — ED Provider Notes (Signed)
[facility] EMERGENCY DEPARTMENT Provider Note   CSN: [id number] Arrival date & time: 05/09/22  0813     History  Chief Complaint  Patient presents with   Hypertension   Chest Pain    Ashley Espinoza is a 88 y.o. female.  HPI     88yo female with history of AVR 2009, CKD, hypertension, RLS who presents with episodes of feeling badly, palpitations, elevated blood pressure an nausea.   Has had 4 episodes of this Yesterday afternoon started and has been coming and going frequently, felt really bad during th night, a tingling feeling all over, blood pressure shoots up Other episodes last half a day or more, will wax and wane during that time, about 3 weeks ago is when these episodes started Can't relate it to anything else First time felt heart thumping around/racing, BP was 201/99 then went down until it was back to normal, normally BP under control with current medication Thought it was from dehydration when it has happened and drinking water has helped  Describes these as "attacks", when blood pressure goes up, feels nausea/mild, feels worried about what is going on, mostly feeling in muscles, can't really describe it, feels like an uncomfortable feeling all over body, feels palpitations, lose appetite, comes and goes.   Today BP has not gone back to normal like it normally does then will feel like herself.    No chest pain with it but does feel shortness of breath.  Felt like nasal passages stopped up like concrete.  Yesterday just mild headache for a short time.    No fever, cough, no urinary symptoms, vomiting, diarrhea or constipation Wrist stiff, when a child had a uti and wrist was stiff  Went up on dose of requip recently but symptoms started before that Past Medical History:  Diagnosis Date   Hypertension    RLS (restless legs syndrome)    Sleep apnea     Home Medications Prior to Admission medications   Medication Sig Start Date End Date Taking?  Authorizing Provider  amLODipine (NORVASC) 5 MG tablet Take 1 tablet (5 mg total) by mouth daily. 05/09/22 06/08/22 Yes Alvira Monday, MD  cephALEXin (KEFLEX) 500 MG capsule Take 1 capsule (500 mg total) by mouth 2 (two) times daily for 7 days. 05/09/22 05/16/22 Yes Alvira Monday, MD  amitriptyline (ELAVIL) 50 MG tablet Take 50 mg by mouth at bedtime.    [provider]  COMBIGAN 0.2-0.5 % ophthalmic solution  11/18/21   [provider]  febuxostat (ULORIC) 40 MG tablet Take 40 mg by mouth daily. 03/02/22   [provider]  furosemide (LASIX) 20 MG tablet Take 20 mg by mouth 2 (two) times daily. 03/07/22   [provider]  gabapentin (NEURONTIN) 300 MG capsule Take 300 mg by mouth 3 (three) times daily.    [provider]  losartan (COZAAR) 100 MG tablet Take 100 mg by mouth daily. 04/18/22   [provider]  olmesartan (BENICAR) 20 MG tablet Take 20 mg by mouth daily.    [provider]  oxyCODONE-acetaminophen (PERCOCET/ROXICET) 5-325 MG tablet Take by mouth. 03/21/22   [provider]  potassium chloride SA (KLOR-CON M) 20 MEQ tablet Take 20 mEq by mouth daily. 04/16/22   [provider]  probenecid (BENEMID) 500 MG tablet Take 500 mg by mouth 2 (two) times daily.    [provider]  rOPINIRole (REQUIP XL) 2 MG 24 hr tablet Take 2 mg by mouth  daily. 04/27/22   [provider]      Allergies    Iodinated contrast media, Ioxaglate, Lidocaine, Benazepril, Clindamycin/lincomycin, and Rotigotine    Review of Systems   Review of Systems  Physical Exam Updated Vital Signs BP 128/81   Pulse 75   Temp 98.6 F (37 C) (Oral)   Resp 16   Ht 5\' 8"  (1.727 m)   Wt 88.5 kg   SpO2 97%   BMI 29.65 kg/m  Physical Exam Vitals and nursing note reviewed.  Constitutional:      General: She is not in acute distress.    Appearance: She is well-developed. She is not diaphoretic.  HENT:     Head:  Normocephalic and atraumatic.  Eyes:     Conjunctiva/sclera: Conjunctivae normal.  Cardiovascular:     Rate and Rhythm: Normal rate and regular rhythm.     Heart sounds: Normal heart sounds. No murmur heard.    No friction rub. No gallop.  Pulmonary:     Effort: Pulmonary effort is normal. No respiratory distress.     Breath sounds: Normal breath sounds. No wheezing or rales.  Abdominal:     General: There is no distension.     Palpations: Abdomen is soft.     Tenderness: There is no abdominal tenderness. There is no guarding.  Musculoskeletal:        General: No tenderness.     Cervical back: Normal range of motion.  Skin:    General: Skin is warm and dry.     Findings: No erythema or rash.  Neurological:     Mental Status: She is alert and oriented to person, place, and time.     ED Results / Procedures / Treatments   Labs (all labs ordered are listed, but only abnormal results are displayed) Labs Reviewed  URINALYSIS, ROUTINE W REFLEX MICROSCOPIC - Abnormal; Notable for the following components:      Result Value   Leukocytes,Ua MODERATE (*)    All other components within normal limits  CBC WITH DIFFERENTIAL/PLATELET - Abnormal; Notable for the following components:   RBC 5.47 (*)    Hemoglobin 15.3 (*)    All other components within normal limits  COMPREHENSIVE METABOLIC PANEL - Abnormal; Notable for the following components:   Glucose, Bld 117 (*)    BUN 42 (*)    Creatinine, Ser 1.34 (*)    GFR, Estimated 38 (*)    All other components within normal limits  URINALYSIS, MICROSCOPIC (REFLEX) - Abnormal; Notable for the following components:   Bacteria, UA FEW (*)    Non Squamous Epithelial PRESENT (*)    All other components within normal limits  TROPONIN I (HIGH SENSITIVITY)  TROPONIN I (HIGH SENSITIVITY)    EKG EKG Interpretation  Date/Time:  Tuesday May 09 2022 08:32:20 EDT Ventricular Rate:  78 PR Interval:  267 QRS Duration: 108 QT  Interval:  356 QTC Calculation: 406 R Axis:   0 Text Interpretation: Sinus rhythm -suspected although ectopic atrial rhythm also possible Ventricular premature complex Prolonged PR interval Low voltage, precordial leads Borderline repolarization abnormality No previous ECGs available Confirmed by Alvira MondaySchlossman, Eshika Reckart ([id number]) on 05/09/2022 9:29:36 AM  Radiology DG Chest 2 View  Result Date: 05/09/2022 CLINICAL DATA:  Chest pain, hypertension EXAM: CHEST - 2 VIEW COMPARISON:  06/19/2021 chest radiograph. FINDINGS: Intact sternotomy wires. Aortic valve prosthesis in place. Stable cardiomediastinal silhouette with normal heart size. No pneumothorax. No pleural effusion. Lungs appear clear, with no acute consolidative airspace  disease and no pulmonary edema. IMPRESSION: No active cardiopulmonary disease. Electronically Signed   By: Delbert Phenix M.D.   On: 05/09/2022 09:19    Procedures Procedures    Medications Ordered in ED Medications  amLODipine (NORVASC) tablet 5 mg (5 mg Oral Given 05/09/22 1201)    ED Course/ Medical Decision Making/ A&P                           Medical Decision Making Amount and/or Complexity of Data Reviewed Labs: ordered. Radiology: ordered.  Risk Prescription drug management.   88yo female with history of AVR 2009, CKD, hypertension, RLS who presents with episodes of feeling badly, palpitations, elevated blood pressure an nausea.   Differential diagnosis includes cardiac arrhythmia, ACS, electrolyte abnormalities, anemia, dehydration, pheochromocytoma.  Do not suspect PE with no significant risk factors, no persistent dyspnea, no tachycardia/tachypnea/hypoxia, leg pain or swelling.  History and exam not consistent with aortic dissection.  Labs completed and personally evaluated interpreted by me show hemoglobin of 15.3, which is mildly increased from 2018, may be in the setting setting of mild dehydration.  Her creatinine and BUN are stable since 2016, and she  has no significant electrolyte abnormalities.  Chest x-ray was completed and personally evaluated by me shows no evidence of pneumonia, pulmonary edema, pneumothorax or other acute cardiopulmonary disease.  UA with possible urinary tract infection, will treat given nausea, feeling fatigue.   Troponin negative X2, do not suspect ACS.   History most consistent with possible intermittent rhythm abnormality.  Recommend Cardiology follow up.  ECG today question ectopic but more convincing for sinus rhythm with a first-degree AV block, and PVCs which has been noted on prior EKGs. BP elevated in ED, has not had BP medications today, given amlodipine in ED with improvement. Recommend continuing BP medications as prescribed, following up with PCP and Cardiology. Patient discharged in stable condition with understanding of reasons to return.         Final Clinical Impression(s) / ED Diagnoses Final diagnoses:  Palpitations  Nausea  Other fatigue    Rx / DC Orders ED Discharge Orders          Ordered    amLODipine (NORVASC) 5 MG tablet  Daily,   Status:  Discontinued        05/09/22 1238    cephALEXin (KEFLEX) 500 MG capsule  2 times daily        05/09/22 1239    amLODipine (NORVASC) 5 MG tablet  Daily        05/09/22 1248              Alvira Monday, MD 05/09/22 2303

## 2022-05-09 NOTE — ED Triage Notes (Addendum)
Pt states she is having attacks where her BP is elevated and she can feel her heart beating and then resolves.  Pt states she has some chest discomfort. Pt states her muscles don't feel right.  She is concerned she may have a UTI due to the fact her left wrist is stiff.

## 2022-05-09 NOTE — ED Notes (Signed)
Pt states she doesn't have a mental problem, but it is truly a physical thing.

## 2023-12-05 IMAGING — DX DG CHEST 2V
2 series · 2 of 2 positions shown · non-contrast
Comparison: 06/19/2021 chest radiograph.

CLINICAL DATA: Chest pain, hypertension

EXAM:
CHEST - 2 VIEW

[chest lat]
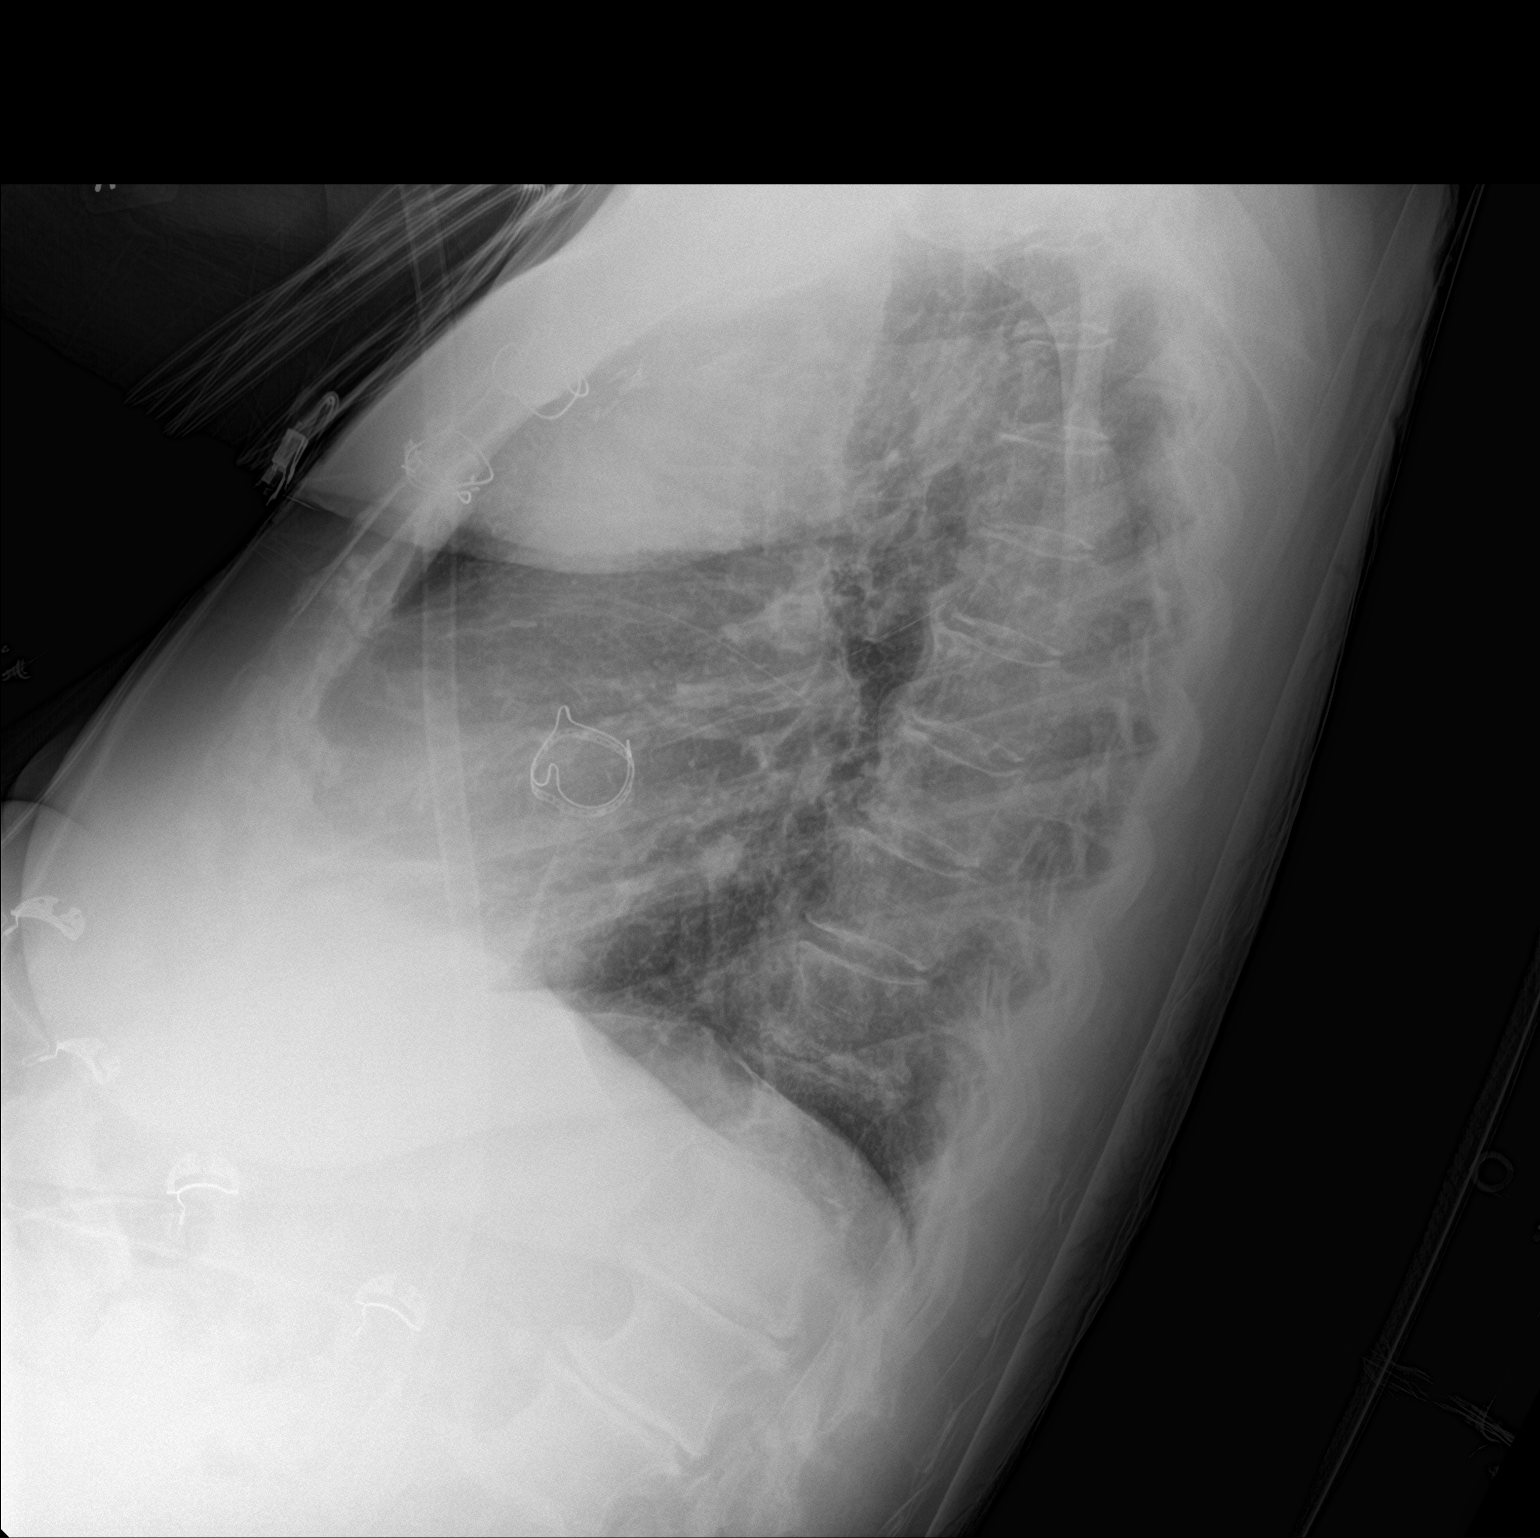

[chest ap strecther]
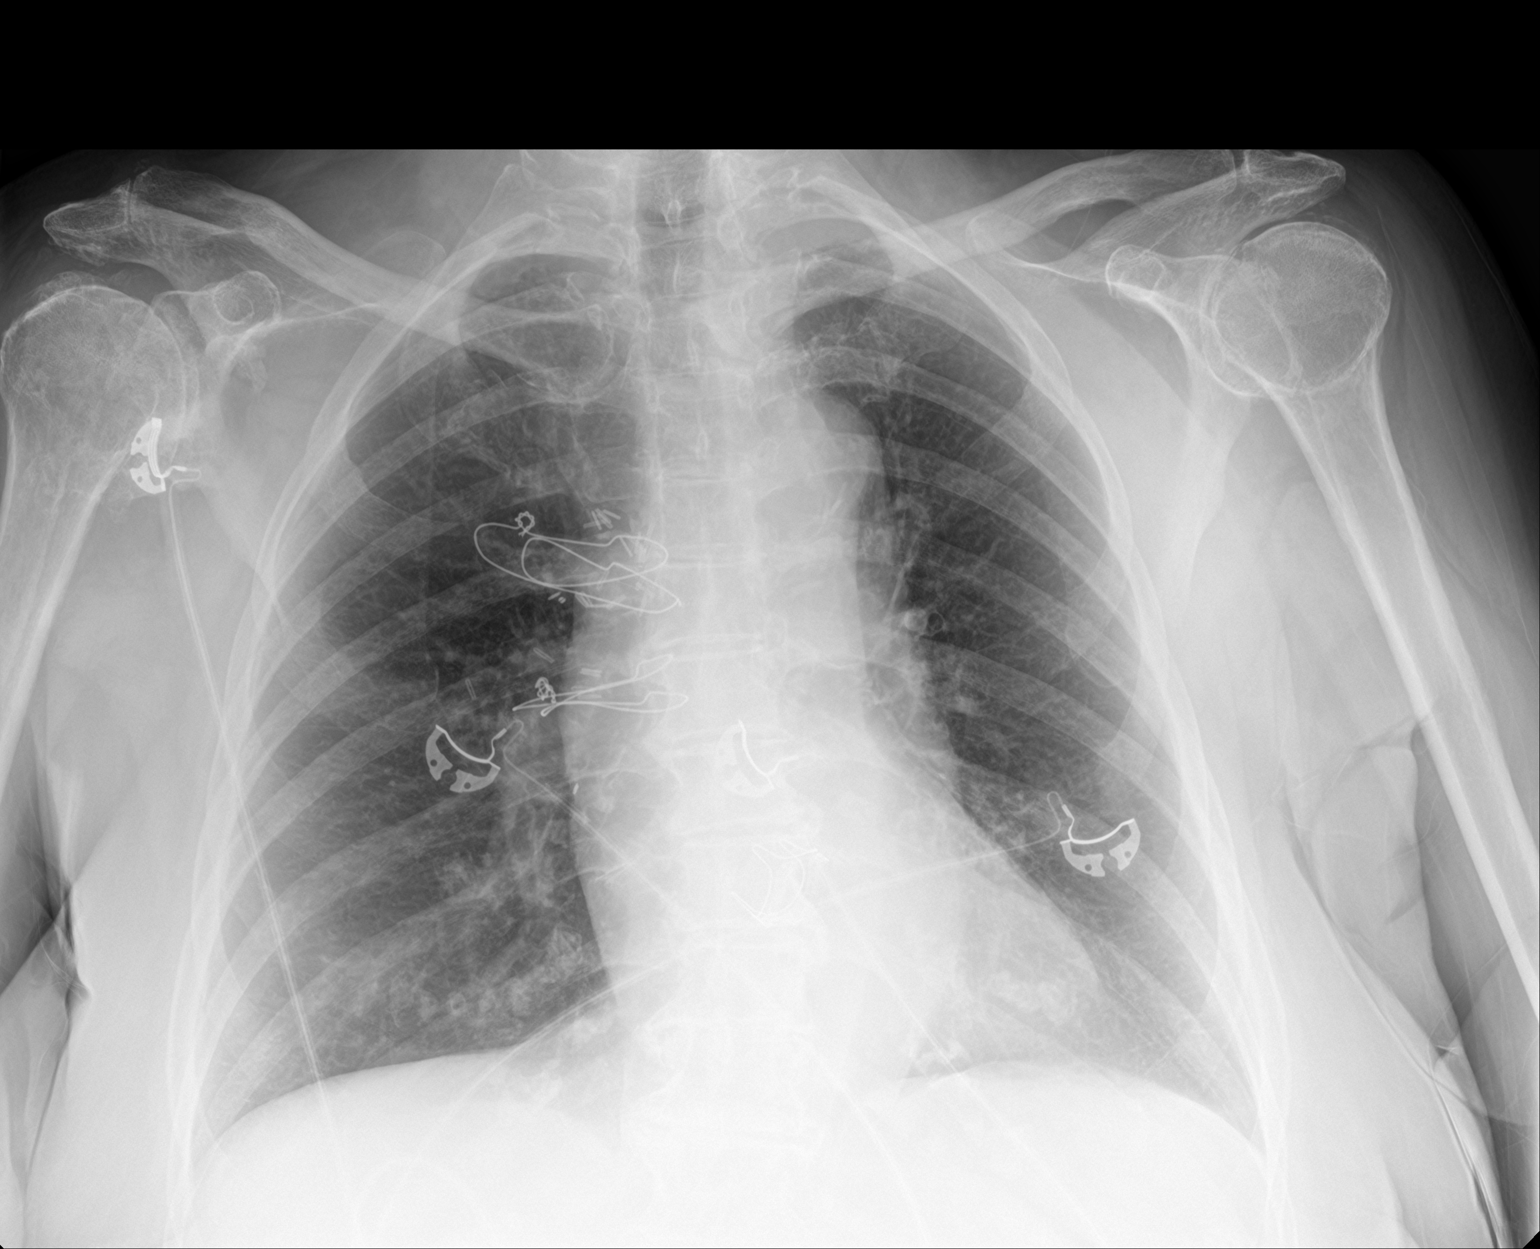

[2 of 2 positions shown; findings below may reference images not displayed]

FINDINGS: Intact sternotomy wires. Aortic valve prosthesis in place. Stable
cardiomediastinal silhouette with normal heart size. No
pneumothorax. No pleural effusion. Lungs appear clear, with no acute
consolidative airspace disease and no pulmonary edema.
IMPRESSION: No active cardiopulmonary disease.

## 2024-03-19 ENCOUNTER — Emergency Department (HOSPITAL_BASED_OUTPATIENT_CLINIC_OR_DEPARTMENT_OTHER)
Admission: EM | Admit: 2024-03-19 | Discharge: 2024-03-19 | Disposition: A | Discharge status: Home / Self Care | Attending: Emergency Medicine | Admitting: Emergency Medicine

## 2024-03-19 ENCOUNTER — Emergency Department (HOSPITAL_BASED_OUTPATIENT_CLINIC_OR_DEPARTMENT_OTHER)

## 2024-03-19 ENCOUNTER — Encounter (HOSPITAL_BASED_OUTPATIENT_CLINIC_OR_DEPARTMENT_OTHER): Payer: Self-pay

## 2024-03-19 ENCOUNTER — Other Ambulatory Visit: Payer: Self-pay

## 2024-03-19 DIAGNOSIS — N3000 Acute cystitis without hematuria: Secondary | ICD-10-CM | POA: Diagnosis not present

## 2024-03-19 DIAGNOSIS — R1030 Lower abdominal pain, unspecified: Secondary | ICD-10-CM

## 2024-03-19 DIAGNOSIS — I10 Essential (primary) hypertension: Secondary | ICD-10-CM | POA: Diagnosis not present

## 2024-03-19 DIAGNOSIS — Z79899 Other long term (current) drug therapy: Secondary | ICD-10-CM | POA: Diagnosis not present

## 2024-03-19 DIAGNOSIS — R0602 Shortness of breath: Secondary | ICD-10-CM

## 2024-03-19 DIAGNOSIS — E860 Dehydration: Secondary | ICD-10-CM | POA: Diagnosis not present

## 2024-03-19 DIAGNOSIS — R1032 Left lower quadrant pain: Secondary | ICD-10-CM | POA: Diagnosis present

## 2024-03-19 DIAGNOSIS — I998 Other disorder of circulatory system: Secondary | ICD-10-CM

## 2024-03-19 LAB — COMPREHENSIVE METABOLIC PANEL WITH GFR
ALT: 9 U/L (ref 0–44)
AST: 20 U/L (ref 15–41)
Albumin: 4.4 g/dL (ref 3.5–5.0)
Alkaline Phosphatase: 81 U/L (ref 38–126)
Anion gap: 17 — ABNORMAL HIGH (ref 5–15)
BUN: 24 mg/dL — ABNORMAL HIGH (ref 8–23)
CO2: 19 mmol/L — ABNORMAL LOW (ref 22–32)
Calcium: 10.6 mg/dL — ABNORMAL HIGH (ref 8.9–10.3)
Chloride: 108 mmol/L (ref 98–111)
Creatinine, Ser: 1.33 mg/dL — ABNORMAL HIGH (ref 0.44–1.00)
GFR, Estimated: 38 mL/min — ABNORMAL LOW (ref >60)
Glucose, Bld: 140 mg/dL — ABNORMAL HIGH (ref 70–99)
Potassium: 3.5 mmol/L (ref 3.5–5.1)
Sodium: 144 mmol/L (ref 135–145)
Total Bilirubin: 0.7 mg/dL (ref 0.0–1.2)
Total Protein: 8.1 g/dL (ref 6.5–8.1)

## 2024-03-19 LAB — URINALYSIS, ROUTINE W REFLEX MICROSCOPIC
Bilirubin Urine: NEGATIVE
Glucose, UA: NEGATIVE mg/dL
Ketones, ur: NEGATIVE mg/dL
Nitrite: NEGATIVE
Protein, ur: 100 mg/dL — AB
Specific Gravity, Urine: 1.015 (ref 1.005–1.030)
pH: 7 (ref 5.0–8.0)

## 2024-03-19 LAB — CBC
HCT: 45.3 % (ref 36.0–46.0)
Hemoglobin: 15.3 g/dL — ABNORMAL HIGH (ref 12.0–15.0)
MCH: 27.9 pg (ref 26.0–34.0)
MCHC: 33.8 g/dL (ref 30.0–36.0)
MCV: 82.5 fL (ref 80.0–100.0)
Platelets: 204 10*3/uL (ref 150–400)
RBC: 5.49 MIL/uL — ABNORMAL HIGH (ref 3.87–5.11)
RDW: 13 % (ref 11.5–15.5)
WBC: 8.2 10*3/uL (ref 4.0–10.5)
nRBC: 0 % (ref 0.0–0.2)

## 2024-03-19 LAB — URINALYSIS, MICROSCOPIC (REFLEX)

## 2024-03-19 LAB — LIPASE, BLOOD: Lipase: 14 U/L (ref 11–51)

## 2024-03-19 LAB — TROPONIN T, HIGH SENSITIVITY
Troponin T High Sensitivity: 21 ng/L — ABNORMAL HIGH (ref <19)
Troponin T High Sensitivity: 23 ng/L — ABNORMAL HIGH (ref <19)

## 2024-03-19 MED ORDER — ONDANSETRON HCL 4 MG/2ML IJ SOLN
4.0000 mg | Freq: Once | INTRAMUSCULAR | Status: AC
  Administered 2024-03-19: 4 mg via INTRAVENOUS
  Filled 2024-03-19: qty 2

## 2024-03-19 MED ORDER — LOSARTAN POTASSIUM 25 MG PO TABS
25.0000 mg | ORAL_TABLET | Freq: Every day | ORAL | Status: DC
  Administered 2024-03-19: 25 mg via ORAL
  Filled 2024-03-19: qty 1

## 2024-03-19 MED ORDER — CEPHALEXIN 500 MG PO CAPS
500.0000 mg | ORAL_CAPSULE | Freq: Three times a day (TID) | ORAL | 0 refills | Status: AC
Start: 2024-03-19 — End: 2024-03-26

## 2024-03-19 MED ORDER — KETOROLAC TROMETHAMINE 15 MG/ML IJ SOLN
15.0000 mg | Freq: Once | INTRAMUSCULAR | Status: AC
  Administered 2024-03-19: 15 mg via INTRAVENOUS
  Filled 2024-03-19: qty 1

## 2024-03-19 MED ORDER — CEPHALEXIN 250 MG PO CAPS
500.0000 mg | ORAL_CAPSULE | Freq: Once | ORAL | Status: AC
  Administered 2024-03-19: 500 mg via ORAL
  Filled 2024-03-19: qty 2

## 2024-03-19 MED ORDER — AMLODIPINE BESYLATE 5 MG PO TABS
10.0000 mg | ORAL_TABLET | Freq: Once | ORAL | Status: AC
  Administered 2024-03-19: 10 mg via ORAL
  Filled 2024-03-19: qty 2

## 2024-03-19 MED ORDER — SODIUM CHLORIDE 0.9 % IV BOLUS
500.0000 mL | Freq: Once | INTRAVENOUS | Status: AC
  Administered 2024-03-19: 500 mL via INTRAVENOUS

## 2024-03-19 MED ORDER — ONDANSETRON HCL 4 MG/2ML IJ SOLN
4.0000 mg | Freq: Once | INTRAMUSCULAR | Status: DC
  Filled 2024-03-19: qty 2

## 2024-03-19 MED ORDER — ONDANSETRON 4 MG PO TBDP
4.0000 mg | ORAL_TABLET | Freq: Once | ORAL | Status: AC
  Administered 2024-03-19: 4 mg via ORAL
  Filled 2024-03-19: qty 1

## 2024-03-19 NOTE — ED Provider Notes (Signed)
 [place] [facility] AT [facility] Provider Note   CSN: [id number] Arrival date & time: 03/19/24  2694     History  Chief Complaint  Patient presents with   Abdominal Pain    Ashley Espinoza is a 89 y.o. female.  Patient is a 89 yo female with pmh of htn, sleep apnea, and restless leg syndrome presenting to emergency department for abdominal pain. Pt admits to left lower quadrant abdominal pain, nonradiating, described as mild in nature, intermittent, x 4 days.  Patient admits to some intermittent nausea.  No vomiting, diarrhea, melena, hematochezia.  Last bowel movement was prior to arrival and described as soft and normal.  Admits to decreased p.o. intake.  Patient lives independently, uses a wheelchair, and has a caretaker come to the house and her daughter.  Patient states " I think it is just from not getting up from a chair enough".  States she has had these symptoms in the past and was admitted twice to the hospital with no clear diagnosis after a long workup.  I spoke with patient's daughter, Armin Landing, over the phone who states that the patient told her she wanted to come in to the hospital today for shortness of breath and feeling like she " could not catch her breath.  The history is provided by the patient. No language interpreter was used.  Abdominal Pain Associated symptoms: nausea   Associated symptoms: no chest pain, no chills, no cough, no dysuria, no fever, no hematuria, no shortness of breath, no sore throat and no vomiting        Home Medications Prior to Admission medications   Medication Sig Start Date End Date Taking? Authorizing Provider  cephALEXin  (KEFLEX ) 500 MG capsule Take 1 capsule (500 mg total) by mouth 3 (three) times daily for 7 days. 03/19/24 03/26/24 Yes Owen Blowers P, DO  amitriptyline (ELAVIL) 50 MG tablet Take 50 mg by mouth at bedtime.    [provider]  amLODipine  (NORVASC ) 5 MG tablet Take 1 tablet (5 mg  total) by mouth daily. 05/09/22 06/08/22  Scarlette Currier, MD  COMBIGAN 0.2-0.5 % ophthalmic solution  11/18/21   [provider]  febuxostat (ULORIC) 40 MG tablet Take 40 mg by mouth daily. 03/02/22   [provider]  furosemide (LASIX) 20 MG tablet Take 20 mg by mouth 2 (two) times daily. 03/07/22   [provider]  gabapentin (NEURONTIN) 300 MG capsule Take 300 mg by mouth 3 (three) times daily.    [provider]  losartan  (COZAAR ) 100 MG tablet Take 100 mg by mouth daily. 04/18/22   [provider]  olmesartan (BENICAR) 20 MG tablet Take 20 mg by mouth daily.    [provider]  oxyCODONE -acetaminophen  (PERCOCET/ROXICET) 5-325 MG tablet Take by mouth. 03/21/22   [provider]  potassium chloride SA (KLOR-CON M) 20 MEQ tablet Take 20 mEq by mouth daily. 04/16/22   [provider]  probenecid (BENEMID) 500 MG tablet Take 500 mg by mouth 2 (two) times daily.    [provider]  rOPINIRole (REQUIP XL) 2 MG 24 hr tablet Take 2 mg by mouth daily. 04/27/22   [provider]      Allergies    Iodinated contrast media, Ioxaglate, Lidocaine, Benazepril, Clindamycin/lincomycin, and Rotigotine    Review of Systems   Review of Systems  Constitutional:  Negative for chills and fever.  HENT:  Negative for ear pain and sore throat.   Eyes:  Negative for pain and visual disturbance.  Respiratory:  Negative for cough and shortness of breath.   Cardiovascular:  Negative for chest pain and palpitations.  Gastrointestinal:  Positive for abdominal pain and nausea. Negative for vomiting.  Genitourinary:  Negative for dysuria and hematuria.  Musculoskeletal:  Negative for arthralgias and back pain.  Skin:  Negative for color change and rash.  Neurological:  Negative for seizures and syncope.  All other systems reviewed and are negative.   Physical Exam Updated Vital Signs BP (!) 163/91   Pulse 92   Temp 98 F (36.7 C)  (Oral)   Resp 16   SpO2 94%  Physical Exam Vitals and nursing note reviewed.  Constitutional:      General: She is not in acute distress.    Appearance: She is well-developed.  HENT:     Head: Normocephalic and atraumatic.  Eyes:     Conjunctiva/sclera: Conjunctivae normal.  Cardiovascular:     Rate and Rhythm: Normal rate and regular rhythm.     Heart sounds: No murmur heard. Pulmonary:     Effort: Pulmonary effort is normal. No respiratory distress.     Breath sounds: Normal breath sounds.  Abdominal:     Palpations: Abdomen is soft.     Tenderness: There is no abdominal tenderness.  Musculoskeletal:        General: No swelling.     Cervical back: Neck supple.  Skin:    General: Skin is warm and dry.     Capillary Refill: Capillary refill takes less than 2 seconds.  Neurological:     Mental Status: She is alert.  Psychiatric:        Mood and Affect: Mood normal.     ED Results / Procedures / Treatments   Labs (all labs ordered are listed, but only abnormal results are displayed) Labs Reviewed  COMPREHENSIVE METABOLIC PANEL WITH GFR - Abnormal; Notable for the following components:      Result Value   CO2 19 (*)    Glucose, Bld 140 (*)    BUN 24 (*)    Creatinine, Ser 1.33 (*)    Calcium 10.6 (*)    GFR, Estimated 38 (*)    Anion gap 17 (*)    All other components within normal limits  CBC - Abnormal; Notable for the following components:   RBC 5.49 (*)    Hemoglobin 15.3 (*)    All other components within normal limits  URINALYSIS, ROUTINE W REFLEX MICROSCOPIC - Abnormal; Notable for the following components:   APPearance CLOUDY (*)    Hgb urine dipstick TRACE (*)    Protein, ur 100 (*)    Leukocytes,Ua SMALL (*)    All other components within normal limits  URINALYSIS, MICROSCOPIC (REFLEX) - Abnormal; Notable for the following components:   Bacteria, UA MANY (*)    All other components within normal limits  TROPONIN T, HIGH SENSITIVITY - Abnormal;  Notable for the following components:   Troponin T High Sensitivity 21 (*)    All other components within normal limits  TROPONIN T, HIGH SENSITIVITY - Abnormal; Notable for the following components:   Troponin T High Sensitivity 23 (*)    All other components within normal limits  URINE CULTURE  LIPASE, BLOOD    EKG EKG Interpretation Date/Time:  Wednesday March 19 2024 07:43:42 EDT Ventricular Rate:  95 PR Interval:  265 QRS Duration:  92 QT Interval:  282 QTC Calculation: 355 R Axis:   41  Text Interpretation: Sinus rhythm Premature ventricular complexes Prolonged PR interval Confirmed by Owen Blowers ([id number]) on 03/19/2024 7:46:13 AM  Radiology DG Chest Portable 1 View Result Date: 03/19/2024 CLINICAL DATA:  Shortness of breath. EXAM: PORTABLE CHEST 1 VIEW COMPARISON:  05/09/2022 FINDINGS: Status post median sternotomy. Aortic valve replacement. Stable cardiomediastinal contours. Aortic atherosclerosis. No pleural fluid, interstitial edema or airspace consolidation. Visualized osseous structures are notable for advanced degenerative changes within the right glenohumeral joint. Lumbar spondylosis noted. IMPRESSION: No acute cardiopulmonary abnormalities. Aortic Atherosclerosis (ICD10-I70.0). Electronically Signed   By: Kimberley Penman M.D.   On: 03/19/2024 08:04    Procedures Procedures    Medications Ordered in ED Medications  losartan  (COZAAR ) tablet 25 mg (25 mg Oral Given 03/19/24 0923)  ketorolac  (TORADOL ) 15 MG/ML injection 15 mg (15 mg Intravenous Given 03/19/24 0756)  ondansetron  (ZOFRAN ) injection 4 mg (4 mg Intravenous Given 03/19/24 0756)  sodium chloride  0.9 % bolus 500 mL (500 mLs Intravenous New Bag/Given 03/19/24 0929)  cephALEXin  (KEFLEX ) capsule 500 mg (500 mg Oral Given 03/19/24 0923)  amLODipine  (NORVASC ) tablet 10 mg (10 mg Oral Given 03/19/24 4098)    ED Course/ Medical Decision Making/ A&P                                 Medical Decision Making Amount  and/or Complexity of Data Reviewed Labs: ordered. Radiology: ordered.  Risk Prescription drug management.   89 yo female with pmh of htn, sleep apnea, and restless leg syndrome presenting to emergency department for lower abdominal pain.  Patient is alert and oriented x 3, no acute distress, afebrile, some vital signs.  Abdomen is soft and nontender on my exam.  Urine was positive for urinary tract infection.  Culture sent.  Keflex  given in ED and sent to pharmacy.  Laboratory studies were concerning for dehydration with a high hemoglobin level.  Creatinine stable with previous studies.  500 cc of IV fluids given.  Patient does admit to decreased p.o. intake.  In addition to that, patient's daughter expressed that the patient had complained of shortness of breath this morning.  Twelve-lead EKG demonstrates sinus rhythm.  Troponin x 2 stable.  Electrolytes stable.  Chest x-ray demonstrates no acute process.  No pneumothorax.  No pleural effusions.  No hemothorax.  Denies a history of chest pain.  She states she is not short of breath at this time.  No hypoxia or signs of respiratory distress.  Lung sounds are clear bilaterally.  Low suspicion pulmonary embolism.  She is hypertensive with a blood pressure of 163/91.  She does not take her antihypertensives today.  Have ordered her home prescriptions of losartan  and Norvasc .  She states her blood pressures normally run stable and are checked by her home nurse.  States they are usually around 130 systolic.  I recommend daily rechecks and follow-up with PCP in 1 to 2 weeks if they remain high.  Patient in no distress and overall condition improved here in the ED. Detailed discussions were had with the patient regarding current findings, and need for close f/u with PCP or on call doctor. The patient has been instructed to return immediately if the symptoms worsen in any way for re-evaluation. Patient verbalized understanding and is in agreement with current care  plan. All questions answered prior to discharge.        Final Clinical Impression(s) / ED Diagnoses Final diagnoses:  Acute cystitis without hematuria  Dehydration  Poorly controlled blood pressure  Lower abdominal pain  SOB (shortness of breath)    Rx / DC Orders ED Discharge Orders          Ordered    cephALEXin  (KEFLEX ) 500 MG capsule  3 times daily        03/19/24 1039              Quinn Bucco, DO 03/19/24 1042

## 2024-03-19 NOTE — ED Triage Notes (Signed)
 Left lower abdominal pain since Sunday, has gotten better here for evaluation. Denies any other Sx. Pt  has not been taking Htn meds.

## 2024-03-19 NOTE — Discharge Instructions (Addendum)
 Today you presented for lower abdominal pain and shortness of breath.  Your studies show that you have a urinary tract infection and signs of dehydration.  You were given IV fluids and antibiotics.  Antibiotic for Keflex  sent to your pharmacy.  We have sent a culture of your urine to make sure this antibiotic covers properly.  We will call you in 24 to 48 hours if for any reason you need an antibiotic change.  In addition to that your blood pressure was high during your visit today above 170 systolic.  We recommend that you continue to take your antihypertensives at the house and have your home aide check your blood pressure daily.  If you continue to run high above 140 systolic please follow-up with your primary care physician in 2 weeks for further medication management.

## 2024-03-20 LAB — URINE CULTURE
